# Patient Record
Sex: Female | Born: 1935 | Race: White | Hispanic: No | Smoking: Never smoker
Health system: Southern US, Community
[De-identification: ages and names within clinical notes are randomized; demographics above are authoritative.]

## PROBLEM LIST (undated history)

## (undated) DIAGNOSIS — J189 Pneumonia, unspecified organism: Secondary | ICD-10-CM

## (undated) DIAGNOSIS — M1990 Unspecified osteoarthritis, unspecified site: Secondary | ICD-10-CM

## (undated) DIAGNOSIS — C4491 Basal cell carcinoma of skin, unspecified: Secondary | ICD-10-CM

## (undated) HISTORY — PX: EYE SURGERY: SHX253

## (undated) HISTORY — PX: APPENDECTOMY: SHX54

## (undated) HISTORY — PX: TONSILLECTOMY: SUR1361

## (undated) HISTORY — PX: ABDOMINAL HYSTERECTOMY: SHX81

---
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
## 2016-11-22 ENCOUNTER — Ambulatory Visit: Payer: Self-pay | Admitting: Orthopedic Surgery

## 2016-12-03 NOTE — H&P (Signed)
TOTAL KNEE ADMISSION H&P  Patient is being admitted for right total knee arthroplasty.  Subjective:  Chief Complaint:right knee pain.  HPI: Holly Tucker, 83 y.o. female, has a history of pain and functional disability in the right knee due to arthritis and has failed non-surgical conservative treatments for greater than 12 weeks to includecorticosteriod injections, viscosupplementation injections, flexibility and strengthening excercises, use of assistive devices, weight reduction as appropriate and activity modification.  Onset of symptoms was gradual, starting 3 years ago with gradually worsening course since that time. The patient noted no past surgery on the right knee(s).  Patient currently rates pain in the bilaterally knee(s) at 8 out of 10 with activity. Patient has worsening of pain with activity and weight bearing, pain that interferes with activities of daily living, pain with passive range of motion and joint swelling.  Patient has evidence of subchondral sclerosis, periarticular osteophytes and joint space narrowing by imaging studies. There is no active infection.  There are no active problems to display for this patient.  No past medical history on file.  No past surgical history on file.  No prescriptions prior to admission.   Allergies not on file  Social History  Substance Use Topics  . Smoking status: Not on file  . Smokeless tobacco: Not on file  . Alcohol use Not on file    No family history on file.   Review of Systems  Constitutional: Negative.   HENT: Negative.   Eyes: Negative.   Respiratory: Negative.   Cardiovascular: Negative.   Gastrointestinal: Negative.   Genitourinary: Negative.   Musculoskeletal: Positive for joint pain.  Skin: Negative.   Endo/Heme/Allergies: Negative.   Psychiatric/Behavioral: Negative.     Objective:  Physical Exam  Constitutional: She is oriented to person, place, and time. She appears well-developed.  HENT:  Head:  Normocephalic.  Eyes: EOM are normal.  Neck: Normal range of motion.  Cardiovascular: Normal rate, normal heart sounds and intact distal pulses.   Respiratory: Effort normal.  Genitourinary:  Genitourinary Comments: Deferred  Musculoskeletal:  Right knee pain with ambulating. Fair ROM. Knee is stable at varus and valgus stress. BLE grossly n/v intact.  Neurological: She is alert and oriented to person, place, and time. She has normal reflexes.  Skin: Skin is warm and dry.  Psychiatric: Her behavior is normal.    Vital signs in last 24 hours: BP: ()/()  Arterial Line BP: ()/()   Labs:   There is no height or weight on file to calculate BMI.   Imaging Review Plain radiographs demonstrate moderate degenerative joint disease of the right knee(s). The overall alignment ismild varus. The bone quality appears to be good for age and reported activity level.  Assessment/Plan:  End stage arthritis, right knee   The patient history, physical examination, clinical judgment of the provider and imaging studies are consistent with end stage degenerative joint disease of the right knee(s) and total knee arthroplasty is deemed medically necessary. The treatment options including medical management, injection therapy arthroscopy and arthroplasty were discussed at length. The risks and benefits of total knee arthroplasty were presented and reviewed. The risks due to aseptic loosening, infection, stiffness, patella tracking problems, thromboembolic complications and other imponderables were discussed. The patient acknowledged the explanation, agreed to proceed with the plan and consent was signed. Patient is being admitted for inpatient treatment for surgery, pain control, PT, OT, prophylactic antibiotics, VTE prophylaxis, progressive ambulation and ADL's and discharge planning. The patient is planning to be discharged home with  home health services.  Will use IV tranexamic acid. Contraindications and  adverse affects of Tranexamic acid discussed in detail. Patient denies any of these at this time and understands the risks and benefits.

## 2016-12-17 ENCOUNTER — Encounter (HOSPITAL_COMMUNITY)
Admission: RE | Admit: 2016-12-17 | Discharge: 2016-12-17 | Disposition: A | Discharge status: Home / Self Care | Payer: Medicare Other | Source: Ambulatory Visit | Attending: Specialist | Admitting: Specialist

## 2016-12-17 ENCOUNTER — Encounter (HOSPITAL_COMMUNITY): Payer: Self-pay | Admitting: *Deleted

## 2016-12-17 DIAGNOSIS — M1711 Unilateral primary osteoarthritis, right knee: Secondary | ICD-10-CM | POA: Diagnosis not present

## 2016-12-17 DIAGNOSIS — Z01812 Encounter for preprocedural laboratory examination: Secondary | ICD-10-CM | POA: Diagnosis present

## 2016-12-17 HISTORY — DX: Unspecified osteoarthritis, unspecified site: M19.90

## 2016-12-17 HISTORY — DX: Pneumonia, unspecified organism: J18.9

## 2016-12-17 LAB — BASIC METABOLIC PANEL
ANION GAP: 10 (ref 5–15)
BUN: 24 mg/dL — ABNORMAL HIGH (ref 6–20)
CALCIUM: 10.6 mg/dL — AB (ref 8.9–10.3)
CHLORIDE: 101 mmol/L (ref 101–111)
CO2: 28 mmol/L (ref 22–32)
Creatinine, Ser: 0.85 mg/dL (ref 0.44–1.00)
GFR calc non Af Amer: >60 mL/min (ref >60)
Glucose, Bld: 104 mg/dL — ABNORMAL HIGH (ref 65–99)
Potassium: 4.7 mmol/L (ref 3.5–5.1)
Sodium: 139 mmol/L (ref 135–145)

## 2016-12-17 LAB — CBC
HCT: 43.8 % (ref 36.0–46.0)
HEMOGLOBIN: 14.7 g/dL (ref 12.0–15.0)
MCH: 30.3 pg (ref 26.0–34.0)
MCHC: 33.6 g/dL (ref 30.0–36.0)
MCV: 90.3 fL (ref 78.0–100.0)
Platelets: 218 10*3/uL (ref 150–400)
RBC: 4.85 MIL/uL (ref 3.87–5.11)
RDW: 12.8 % (ref 11.5–15.5)
WBC: 10 10*3/uL (ref 4.0–10.5)

## 2016-12-17 LAB — URINALYSIS, ROUTINE W REFLEX MICROSCOPIC
BILIRUBIN URINE: NEGATIVE
Glucose, UA: NEGATIVE mg/dL
Hgb urine dipstick: NEGATIVE
Ketones, ur: NEGATIVE mg/dL
Nitrite: POSITIVE — AB
PH: 5 (ref 5.0–8.0)
Protein, ur: NEGATIVE mg/dL
SPECIFIC GRAVITY, URINE: 1.015 (ref 1.005–1.030)

## 2016-12-17 LAB — PROTIME-INR
INR: 0.99
PROTHROMBIN TIME: 13 s (ref 11.4–15.2)

## 2016-12-17 LAB — SURGICAL PCR SCREEN
MRSA, PCR: NEGATIVE
STAPHYLOCOCCUS AUREUS: NEGATIVE

## 2016-12-17 LAB — ABO/RH: ABO/RH(D): O POS

## 2016-12-17 LAB — APTT: aPTT: 50 seconds — ABNORMAL HIGH (ref 24–36)

## 2016-12-17 NOTE — Patient Instructions (Signed)
Holly Tucker  12/17/2016   Your procedure is scheduled on: Friday 12/21/2016  Report to [facility] Main  Entrance take [place]  elevators to 3rd floor to  [place] at  [place] AM.  Call this number if you have problems the morning of surgery [phone]   Remember: ONLY 1 PERSON MAY GO WITH YOU TO SHORT STAY TO GET  READY MORNING OF [place].   Do not eat food or drink liquids :After Midnight.     Take these medicines the morning of surgery with A SIP OF WATER: Gabapentin                                 You may not have any metal on your body including hair pins and              piercings  Do not wear jewelry, make-up, lotions, powders or perfumes, deodorant             Do not wear nail polish.  Do not shave  48 hours prior to surgery.              Men may shave face and neck.   Do not bring valuables to the hospital. [place].  Contacts, dentures or bridgework may not be worn into surgery.  Leave suitcase in the car. After surgery it may be brought to your room.                  Please read over the following fact sheets you were given: _____________________________________________________________________             [facility] - Preparing for Surgery Before surgery, you can play an important role.  Because skin is not sterile, your skin needs to be as free of germs as possible.  You can reduce the number of germs on your skin by washing with CHG (chlorahexidine gluconate) soap before surgery.  CHG is an antiseptic cleaner which kills germs and bonds with the skin to continue killing germs even after washing. Please DO NOT use if you have an allergy to CHG or antibacterial soaps.  If your skin becomes reddened/irritated stop using the CHG and inform your nurse when you arrive at Short Stay. Do not shave (including legs and underarms) for at least 48 hours prior to the first CHG shower.  You may  shave your face/neck. Please follow these instructions carefully:  1.  Shower with CHG Soap the night before surgery and the  morning of Surgery.  2.  If you choose to wash your hair, wash your hair first as usual with your  normal  shampoo.  3.  After you shampoo, rinse your hair and body thoroughly to remove the  shampoo.                           4.  Use CHG as you would any other liquid soap.  You can apply chg directly  to the skin and wash                       Gently with a scrungie or clean washcloth.  5.  Apply  the CHG Soap to your body ONLY FROM THE NECK DOWN.   Do not use on face/ open                           Wound or open sores. Avoid contact with eyes, ears mouth and genitals (private parts).                       Wash face,  Genitals (private parts) with your normal soap.             6.  Wash thoroughly, paying special attention to the area where your surgery  will be performed.  7.  Thoroughly rinse your body with warm water from the neck down.  8.  DO NOT shower/wash with your normal soap after using and rinsing off  the CHG Soap.                9.  Pat yourself dry with a clean towel.            10.  Wear clean pajamas.            11.  Place clean sheets on your bed the night of your first shower and do not  sleep with pets. Day of Surgery : Do not apply any lotions/deodorants the morning of surgery.  Please wear clean clothes to the hospital/surgery center.  FAILURE TO FOLLOW THESE INSTRUCTIONS MAY RESULT IN THE CANCELLATION OF YOUR SURGERY PATIENT SIGNATURE_________________________________  NURSE SIGNATURE__________________________________  ________________________________________________________________________   Adam Phenix  An incentive spirometer is a tool that can help keep your lungs clear and active. This tool measures how well you are filling your lungs with each breath. Taking long deep breaths may help reverse or decrease the chance of developing  breathing (pulmonary) problems (especially infection) following:  A long period of time when you are unable to move or be active. BEFORE THE PROCEDURE   If the spirometer includes an indicator to show your best effort, your nurse or respiratory therapist will set it to a desired goal.  If possible, sit up straight or lean slightly forward. Try not to slouch.  Hold the incentive spirometer in an upright position. INSTRUCTIONS FOR USE  1. Sit on the edge of your bed if possible, or sit up as far as you can in bed or on a chair. 2. Hold the incentive spirometer in an upright position. 3. Breathe out normally. 4. Place the mouthpiece in your mouth and seal your lips tightly around it. 5. Breathe in slowly and as deeply as possible, raising the piston or the ball toward the top of the column. 6. Hold your breath for 3-5 seconds or for as long as possible. Allow the piston or ball to fall to the bottom of the column. 7. Remove the mouthpiece from your mouth and breathe out normally. 8. Rest for a few seconds and repeat Steps 1 through 7 at least 10 times every 1-2 hours when you are awake. Take your time and take a few normal breaths between deep breaths. 9. The spirometer may include an indicator to show your best effort. Use the indicator as a goal to work toward during each repetition. 10. After each set of 10 deep breaths, practice coughing to be sure your lungs are clear. If you have an incision (the cut made at the time of surgery), support your incision when coughing by placing a pillow or rolled  up towels firmly against it. Once you are able to get out of bed, walk around indoors and cough well. You may stop using the incentive spirometer when instructed by your caregiver.  RISKS AND COMPLICATIONS  Take your time so you do not get dizzy or light-headed.  If you are in pain, you may need to take or ask for pain medication before doing incentive spirometry. It is harder to take a deep  breath if you are having pain. AFTER USE  Rest and breathe slowly and easily.  It can be helpful to keep track of a log of your progress. Your caregiver can provide you with a simple table to help with this. If you are using the spirometer at home, follow these instructions: [place] IF:   You are having difficultly using the spirometer.  You have trouble using the spirometer as often as instructed.  Your pain medication is not giving enough relief while using the spirometer.  You develop fever of 100.5 F (38.1 C) or higher. SEEK IMMEDIATE MEDICAL CARE IF:   You cough up bloody sputum that had not been present before.  You develop fever of 102 F (38.9 C) or greater.  You develop worsening pain at or near the incision site. MAKE SURE YOU:   Understand these instructions.  Will watch your condition.  Will get help right away if you are not doing well or get worse. Document Released: 03/18/2007 Document Revised: 01/28/2012 Document Reviewed: 05/19/2007 ExitCare Patient Information 2014 ExitCare, [place].   ________________________________________________________________________  WHAT IS A BLOOD TRANSFUSION? Blood Transfusion Information  A transfusion is the replacement of blood or some of its parts. Blood is made up of multiple cells which provide different functions.  Red blood cells carry oxygen and are used for blood loss replacement.  White blood cells fight against infection.  Platelets control bleeding.  Plasma helps clot blood.  Other blood products are available for specialized needs, such as hemophilia or other clotting disorders. BEFORE THE TRANSFUSION  Who gives blood for transfusions?   Healthy volunteers who are fully evaluated to make sure their blood is safe. This is blood bank blood. Transfusion therapy is the safest it has ever been in the practice of medicine. Before blood is taken from a donor, a complete history is taken to make sure  that person has no history of diseases nor engages in risky social behavior (examples are intravenous drug use or sexual activity with multiple partners). The donor's travel history is screened to minimize risk of transmitting infections, such as malaria. The donated blood is tested for signs of infectious diseases, such as HIV and hepatitis. The blood is then tested to be sure it is compatible with you in order to minimize the chance of a transfusion reaction. If you or a relative donates blood, this is often done in anticipation of surgery and is not appropriate for emergency situations. It takes many days to process the donated blood. RISKS AND COMPLICATIONS Although transfusion therapy is very safe and saves many lives, the main dangers of transfusion include:   Getting an infectious disease.  Developing a transfusion reaction. This is an allergic reaction to something in the blood you were given. Every precaution is taken to prevent this. The decision to have a blood transfusion has been considered carefully by your caregiver before blood is given. Blood is not given unless the benefits outweigh the risks. AFTER THE TRANSFUSION  Right after receiving a blood transfusion, you will usually feel  much better and more energetic. This is especially true if your red blood cells have gotten low (anemic). The transfusion raises the level of the red blood cells which carry oxygen, and this usually causes an energy increase.  The nurse administering the transfusion will monitor you carefully for complications. HOME CARE INSTRUCTIONS  No special instructions are needed after a transfusion. You may find your energy is better. Speak with your caregiver about any limitations on activity for underlying diseases you may have. SEEK MEDICAL CARE IF:   Your condition is not improving after your transfusion.  You develop redness or irritation at the intravenous (IV) site. SEEK IMMEDIATE MEDICAL CARE IF:  Any of  the following symptoms occur over the next 12 hours:  Shaking chills.  You have a temperature by mouth above 102 F (38.9 C), not controlled by medicine.  Chest, back, or muscle pain.  People around you feel you are not acting correctly or are confused.  Shortness of breath or difficulty breathing.  Dizziness and fainting.  You get a rash or develop hives.  You have a decrease in urine output.  Your urine turns a dark color or changes to pink, red, or brown. Any of the following symptoms occur over the next 10 days:  You have a temperature by mouth above 102 F (38.9 C), not controlled by medicine.  Shortness of breath.  Weakness after normal activity.  The white part of the eye turns yellow (jaundice).  You have a decrease in the amount of urine or are urinating less often.  Your urine turns a dark color or changes to pink, red, or brown. Document Released: 11/02/2000 Document Revised: 01/28/2012 Document Reviewed: 06/21/2008 [facility] Patient Information 2014 [place], [place].  _______________________________________________________________________

## 2016-12-17 NOTE — Progress Notes (Signed)
04/24/2016- Pre-operative clearance from Dr. Matthias Hughs on chart. 09/18/2016- Office note from Dr. Matthias Hughs on chart.

## 2016-12-21 ENCOUNTER — Encounter (HOSPITAL_COMMUNITY)
Admission: RE | Disposition: A | Discharge status: Home Health | Payer: Self-pay | Source: Ambulatory Visit | Attending: Specialist

## 2016-12-21 ENCOUNTER — Inpatient Hospital Stay (HOSPITAL_COMMUNITY): Payer: Medicare Other | Admitting: Certified Registered Nurse Anesthetist

## 2016-12-21 ENCOUNTER — Encounter (HOSPITAL_COMMUNITY): Payer: Self-pay | Admitting: *Deleted

## 2016-12-21 ENCOUNTER — Inpatient Hospital Stay (HOSPITAL_COMMUNITY)
Admission: RE | Admit: 2016-12-21 | Discharge: 2016-12-24 | DRG: 470 | Disposition: A | Discharge status: Home Health | Payer: Medicare Other | Source: Ambulatory Visit | Attending: Specialist | Admitting: Specialist

## 2016-12-21 DIAGNOSIS — M1711 Unilateral primary osteoarthritis, right knee: Secondary | ICD-10-CM | POA: Diagnosis present

## 2016-12-21 DIAGNOSIS — Z96659 Presence of unspecified artificial knee joint: Secondary | ICD-10-CM

## 2016-12-21 DIAGNOSIS — M25561 Pain in right knee: Secondary | ICD-10-CM | POA: Diagnosis present

## 2016-12-21 DIAGNOSIS — K5900 Constipation, unspecified: Secondary | ICD-10-CM | POA: Diagnosis not present

## 2016-12-21 HISTORY — PX: TOTAL KNEE ARTHROPLASTY: SHX125

## 2016-12-21 LAB — TYPE AND SCREEN
ABO/RH(D): O POS
Antibody Screen: NEGATIVE

## 2016-12-21 SURGERY — ARTHROPLASTY, KNEE, TOTAL
Anesthesia: General | Site: Knee | Laterality: Right

## 2016-12-21 MED ORDER — DEXAMETHASONE SODIUM PHOSPHATE 10 MG/ML IJ SOLN
INTRAMUSCULAR | Status: AC
  Filled 2016-12-21: qty 1

## 2016-12-21 MED ORDER — MENTHOL 3 MG MT LOZG: 1.0000 | LOZENGE | OROMUCOSAL | Status: DC | PRN

## 2016-12-21 MED ORDER — MIDAZOLAM HCL 5 MG/5ML IJ SOLN
INTRAMUSCULAR | Status: DC | PRN
  Administered 2016-12-21 (×2): 1 mg via INTRAVENOUS

## 2016-12-21 MED ORDER — ONDANSETRON HCL 4 MG/2ML IJ SOLN: 4.0000 mg | Freq: Once | INTRAMUSCULAR | Status: DC | PRN

## 2016-12-21 MED ORDER — SODIUM CHLORIDE 0.9 % IV SOLN
1000.0000 mg | INTRAVENOUS | Status: AC
  Administered 2016-12-21: 1000 mg via INTRAVENOUS
  Filled 2016-12-21: qty 1100

## 2016-12-21 MED ORDER — FENTANYL CITRATE (PF) 100 MCG/2ML IJ SOLN
INTRAMUSCULAR | Status: AC
  Filled 2016-12-21: qty 2

## 2016-12-21 MED ORDER — KETOROLAC TROMETHAMINE 30 MG/ML IJ SOLN
INTRAMUSCULAR | Status: DC | PRN
  Administered 2016-12-21: 30 mg

## 2016-12-21 MED ORDER — AMITRIPTYLINE HCL 100 MG PO TABS
100.0000 mg | ORAL_TABLET | Freq: Every day | ORAL | Status: DC
  Administered 2016-12-21 – 2016-12-23 (×3): 100 mg via ORAL
  Filled 2016-12-21 (×3): qty 1

## 2016-12-21 MED ORDER — EPHEDRINE 5 MG/ML INJ
INTRAVENOUS | Status: AC
  Filled 2016-12-21: qty 10

## 2016-12-21 MED ORDER — OXYCODONE HCL 5 MG PO TABS: 5.0000 mg | ORAL_TABLET | Freq: Once | ORAL | Status: DC | PRN

## 2016-12-21 MED ORDER — ALUM & MAG HYDROXIDE-SIMETH 200-200-20 MG/5ML PO SUSP
30.0000 mL | ORAL | Status: DC | PRN
Start: 2016-12-21 — End: 2016-12-24

## 2016-12-21 MED ORDER — FERROUS SULFATE 325 (65 FE) MG PO TABS
325.0000 mg | ORAL_TABLET | Freq: Three times a day (TID) | ORAL | Status: DC
  Filled 2016-12-21 (×3): qty 1

## 2016-12-21 MED ORDER — ONDANSETRON HCL 4 MG/2ML IJ SOLN
INTRAMUSCULAR | Status: DC | PRN
  Administered 2016-12-21: 4 mg via INTRAVENOUS

## 2016-12-21 MED ORDER — OXYCODONE HCL 5 MG/5ML PO SOLN: 5.0000 mg | Freq: Once | ORAL | Status: DC | PRN

## 2016-12-21 MED ORDER — BUPIVACAINE HCL (PF) 0.25 % IJ SOLN
INTRAMUSCULAR | Status: DC | PRN
  Administered 2016-12-21: 30 mL

## 2016-12-21 MED ORDER — CEFAZOLIN SODIUM-DEXTROSE 2-4 GM/100ML-% IV SOLN
2.0000 g | INTRAVENOUS | Status: AC
  Administered 2016-12-21: 2 g via INTRAVENOUS
  Filled 2016-12-21: qty 100

## 2016-12-21 MED ORDER — LACTATED RINGERS IV SOLN: INTRAVENOUS | Status: DC

## 2016-12-21 MED ORDER — PROPOFOL 10 MG/ML IV BOLUS
INTRAVENOUS | Status: AC
  Filled 2016-12-21: qty 40

## 2016-12-21 MED ORDER — METOCLOPRAMIDE HCL 5 MG/ML IJ SOLN: 5.0000 mg | Freq: Three times a day (TID) | INTRAMUSCULAR | Status: DC | PRN

## 2016-12-21 MED ORDER — OXYCODONE HCL 5 MG PO TABS
5.0000 mg | ORAL_TABLET | ORAL | Status: DC | PRN
  Administered 2016-12-21 – 2016-12-24 (×12): 5 mg via ORAL
  Filled 2016-12-21 (×12): qty 1

## 2016-12-21 MED ORDER — SODIUM CHLORIDE 0.9 % IJ SOLN
INTRAMUSCULAR | Status: AC
  Filled 2016-12-21: qty 50

## 2016-12-21 MED ORDER — MIDAZOLAM HCL 2 MG/2ML IJ SOLN
INTRAMUSCULAR | Status: AC
  Filled 2016-12-21: qty 2

## 2016-12-21 MED ORDER — ASPIRIN EC 325 MG PO TBEC: 325.0000 mg | DELAYED_RELEASE_TABLET | Freq: Two times a day (BID) | ORAL | 0 refills | Status: DC

## 2016-12-21 MED ORDER — LACTATED RINGERS IV SOLN
INTRAVENOUS | Status: DC | PRN
  Administered 2016-12-21 (×2): via INTRAVENOUS

## 2016-12-21 MED ORDER — METOCLOPRAMIDE HCL 5 MG PO TABS: 5.0000 mg | ORAL_TABLET | Freq: Three times a day (TID) | ORAL | Status: DC | PRN

## 2016-12-21 MED ORDER — SODIUM CHLORIDE 0.9 % IJ SOLN
INTRAMUSCULAR | Status: DC | PRN
  Administered 2016-12-21: 30 mL

## 2016-12-21 MED ORDER — EPHEDRINE SULFATE 50 MG/ML IJ SOLN
INTRAMUSCULAR | Status: DC | PRN
  Administered 2016-12-21 (×2): 10 mg via INTRAVENOUS

## 2016-12-21 MED ORDER — SUCCINYLCHOLINE CHLORIDE 200 MG/10ML IV SOSY
PREFILLED_SYRINGE | INTRAVENOUS | Status: AC
  Filled 2016-12-21: qty 10

## 2016-12-21 MED ORDER — ONDANSETRON HCL 4 MG/2ML IJ SOLN: 4.0000 mg | Freq: Four times a day (QID) | INTRAMUSCULAR | Status: DC | PRN

## 2016-12-21 MED ORDER — BACLOFEN 10 MG PO TABS: 10.0000 mg | ORAL_TABLET | Freq: Three times a day (TID) | ORAL | 1 refills | Status: DC

## 2016-12-21 MED ORDER — MEPERIDINE HCL 50 MG/ML IJ SOLN: 6.2500 mg | INTRAMUSCULAR | Status: DC | PRN

## 2016-12-21 MED ORDER — DOCUSATE SODIUM 100 MG PO CAPS
100.0000 mg | ORAL_CAPSULE | Freq: Two times a day (BID) | ORAL | Status: DC
  Administered 2016-12-21 – 2016-12-24 (×6): 100 mg via ORAL
  Filled 2016-12-21 (×6): qty 1

## 2016-12-21 MED ORDER — METHOCARBAMOL 1000 MG/10ML IJ SOLN
500.0000 mg | Freq: Four times a day (QID) | INTRAVENOUS | Status: DC | PRN
  Filled 2016-12-21: qty 5

## 2016-12-21 MED ORDER — ACETAMINOPHEN 650 MG RE SUPP: 650.0000 mg | Freq: Four times a day (QID) | RECTAL | Status: DC | PRN

## 2016-12-21 MED ORDER — SODIUM CHLORIDE 0.9 % IR SOLN
Status: DC | PRN
  Administered 2016-12-21: 1000 mL

## 2016-12-21 MED ORDER — KETOROLAC TROMETHAMINE 30 MG/ML IJ SOLN
INTRAMUSCULAR | Status: AC
  Filled 2016-12-21: qty 1

## 2016-12-21 MED ORDER — DEXAMETHASONE SODIUM PHOSPHATE 10 MG/ML IJ SOLN
10.0000 mg | Freq: Once | INTRAMUSCULAR | Status: AC
  Administered 2016-12-22: 09:00:00 10 mg via INTRAVENOUS
  Filled 2016-12-21: qty 1

## 2016-12-21 MED ORDER — LIDOCAINE 2% (20 MG/ML) 5 ML SYRINGE
INTRAMUSCULAR | Status: AC
  Filled 2016-12-21: qty 5

## 2016-12-21 MED ORDER — SUGAMMADEX SODIUM 200 MG/2ML IV SOLN
INTRAVENOUS | Status: DC | PRN
  Administered 2016-12-21: 200 mg via INTRAVENOUS

## 2016-12-21 MED ORDER — GABAPENTIN 300 MG PO CAPS
300.0000 mg | ORAL_CAPSULE | Freq: Three times a day (TID) | ORAL | Status: DC
  Administered 2016-12-21 – 2016-12-24 (×10): 300 mg via ORAL
  Filled 2016-12-21 (×10): qty 1

## 2016-12-21 MED ORDER — CEFAZOLIN SODIUM-DEXTROSE 2-4 GM/100ML-% IV SOLN
INTRAVENOUS | Status: AC
  Filled 2016-12-21: qty 100

## 2016-12-21 MED ORDER — ENOXAPARIN SODIUM 30 MG/0.3ML ~~LOC~~ SOLN
30.0000 mg | Freq: Two times a day (BID) | SUBCUTANEOUS | Status: DC
  Administered 2016-12-22 – 2016-12-24 (×5): 30 mg via SUBCUTANEOUS
  Filled 2016-12-21 (×5): qty 0.3

## 2016-12-21 MED ORDER — FLEET ENEMA 7-19 GM/118ML RE ENEM: 1.0000 | ENEMA | Freq: Once | RECTAL | Status: DC | PRN

## 2016-12-21 MED ORDER — HYDROMORPHONE HCL 1 MG/ML IJ SOLN
0.2500 mg | INTRAMUSCULAR | Status: DC | PRN
Start: 2016-12-21 — End: 2016-12-21

## 2016-12-21 MED ORDER — SODIUM CHLORIDE 0.9 % IV SOLN
INTRAVENOUS | Status: DC
  Administered 2016-12-21 (×2): via INTRAVENOUS
  Filled 2016-12-21 (×5): qty 1000

## 2016-12-21 MED ORDER — COQ-10 200 MG PO CAPS: 1.0000 | ORAL_CAPSULE | Freq: Every day | ORAL | Status: DC

## 2016-12-21 MED ORDER — ROPIVACAINE HCL 7.5 MG/ML IJ SOLN
INTRAMUSCULAR | Status: DC | PRN
  Administered 2016-12-21: 20 mL via PERINEURAL

## 2016-12-21 MED ORDER — DIPHENHYDRAMINE HCL 12.5 MG/5ML PO ELIX: 12.5000 mg | ORAL_SOLUTION | ORAL | Status: DC | PRN

## 2016-12-21 MED ORDER — HYDROMORPHONE HCL 1 MG/ML IJ SOLN
1.0000 mg | INTRAMUSCULAR | Status: DC | PRN
  Administered 2016-12-23: 1 mg via INTRAVENOUS
  Filled 2016-12-21: qty 1

## 2016-12-21 MED ORDER — CEFAZOLIN SODIUM-DEXTROSE 2-4 GM/100ML-% IV SOLN
2.0000 g | Freq: Four times a day (QID) | INTRAVENOUS | Status: AC
  Administered 2016-12-21 (×2): 2 g via INTRAVENOUS
  Filled 2016-12-21 (×2): qty 100

## 2016-12-21 MED ORDER — ZOLPIDEM TARTRATE 5 MG PO TABS: 5.0000 mg | ORAL_TABLET | Freq: Every evening | ORAL | Status: DC | PRN

## 2016-12-21 MED ORDER — ONDANSETRON HCL 4 MG/2ML IJ SOLN
INTRAMUSCULAR | Status: AC
  Filled 2016-12-21: qty 2

## 2016-12-21 MED ORDER — PHENOL 1.4 % MT LIQD
1.0000 | OROMUCOSAL | Status: DC | PRN
  Filled 2016-12-21: qty 177

## 2016-12-21 MED ORDER — BISACODYL 5 MG PO TBEC: 5.0000 mg | DELAYED_RELEASE_TABLET | Freq: Every day | ORAL | Status: DC | PRN

## 2016-12-21 MED ORDER — ACETAMINOPHEN 325 MG PO TABS: 650.0000 mg | ORAL_TABLET | Freq: Four times a day (QID) | ORAL | Status: DC | PRN

## 2016-12-21 MED ORDER — PROPOFOL 10 MG/ML IV BOLUS
INTRAVENOUS | Status: DC | PRN
  Administered 2016-12-21: 150 mg via INTRAVENOUS

## 2016-12-21 MED ORDER — ONDANSETRON HCL 4 MG PO TABS: 4.0000 mg | ORAL_TABLET | Freq: Four times a day (QID) | ORAL | Status: DC | PRN

## 2016-12-21 MED ORDER — METHOCARBAMOL 500 MG PO TABS
500.0000 mg | ORAL_TABLET | Freq: Four times a day (QID) | ORAL | Status: DC | PRN
  Administered 2016-12-21 – 2016-12-22 (×3): 500 mg via ORAL
  Filled 2016-12-21 (×3): qty 1

## 2016-12-21 MED ORDER — FENTANYL CITRATE (PF) 100 MCG/2ML IJ SOLN: 25.0000 ug | INTRAMUSCULAR | Status: DC | PRN

## 2016-12-21 MED ORDER — ROPIVACAINE HCL 7.5 MG/ML IJ SOLN
INTRAMUSCULAR | Status: AC
  Filled 2016-12-21: qty 20

## 2016-12-21 MED ORDER — POTASSIUM CHLORIDE IN NACL 20-0.9 MEQ/L-% IV SOLN: INTRAVENOUS | Status: DC

## 2016-12-21 MED ORDER — LIDOCAINE 2% (20 MG/ML) 5 ML SYRINGE
INTRAMUSCULAR | Status: DC | PRN
  Administered 2016-12-21: 100 mg via INTRAVENOUS

## 2016-12-21 MED ORDER — DEXAMETHASONE SODIUM PHOSPHATE 10 MG/ML IJ SOLN
10.0000 mg | Freq: Once | INTRAMUSCULAR | Status: AC
  Administered 2016-12-21: 10 mg via INTRAVENOUS

## 2016-12-21 MED ORDER — ROCURONIUM BROMIDE 50 MG/5ML IV SOSY
PREFILLED_SYRINGE | INTRAVENOUS | Status: DC | PRN
  Administered 2016-12-21: 40 mg via INTRAVENOUS

## 2016-12-21 MED ORDER — OXYCODONE HCL 5 MG PO TABS: 5.0000 mg | ORAL_TABLET | ORAL | 0 refills | Status: DC | PRN

## 2016-12-21 MED ORDER — BUPIVACAINE HCL (PF) 0.25 % IJ SOLN
INTRAMUSCULAR | Status: AC
  Filled 2016-12-21: qty 30

## 2016-12-21 MED ORDER — FENTANYL CITRATE (PF) 100 MCG/2ML IJ SOLN
INTRAMUSCULAR | Status: DC | PRN
  Administered 2016-12-21 (×6): 50 ug via INTRAVENOUS

## 2016-12-21 MED ORDER — SUGAMMADEX SODIUM 200 MG/2ML IV SOLN
INTRAVENOUS | Status: AC
  Filled 2016-12-21: qty 2

## 2016-12-21 MED ORDER — POLYETHYLENE GLYCOL 3350 17 G PO PACK
17.0000 g | PACK | Freq: Every day | ORAL | Status: DC | PRN
Start: 2016-12-21 — End: 2016-12-24

## 2016-12-21 MED ORDER — ROCURONIUM BROMIDE 50 MG/5ML IV SOSY
PREFILLED_SYRINGE | INTRAVENOUS | Status: AC
  Filled 2016-12-21: qty 5

## 2016-12-21 SURGICAL SUPPLY — 57 items
BAG DECANTER FOR FLEXI CONT (MISCELLANEOUS) IMPLANT
BAG ZIPLOCK 12X15 (MISCELLANEOUS) ×6 IMPLANT
BANDAGE ACE 4X5 VEL STRL LF (GAUZE/BANDAGES/DRESSINGS) ×3 IMPLANT
BANDAGE ACE 6X5 VEL STRL LF (GAUZE/BANDAGES/DRESSINGS) ×3 IMPLANT
BLADE SAG 18X100X1.27 (BLADE) ×3 IMPLANT
BLADE SAW SGTL 13.0X1.19X90.0M (BLADE) ×3 IMPLANT
CAP KNEE TOTAL 3 SIGMA ×3 IMPLANT
CEMENT HV SMART SET (Cement) ×6 IMPLANT
CLOTH BEACON ORANGE TIMEOUT ST (SAFETY) ×3 IMPLANT
CUFF TOURN SGL QUICK 34 (TOURNIQUET CUFF) ×2
CUFF TRNQT CYL 34X4X40X1 (TOURNIQUET CUFF) ×1 IMPLANT
DECANTER SPIKE VIAL GLASS SM (MISCELLANEOUS) ×3 IMPLANT
DERMABOND ADVANCED (GAUZE/BANDAGES/DRESSINGS) ×2
DERMABOND ADVANCED .7 DNX12 (GAUZE/BANDAGES/DRESSINGS) ×1 IMPLANT
DRAPE U-SHAPE 47X51 STRL (DRAPES) ×3 IMPLANT
DRSG AQUACEL AG ADV 3.5X10 (GAUZE/BANDAGES/DRESSINGS) ×3 IMPLANT
DRSG TEGADERM 4X4.75 (GAUZE/BANDAGES/DRESSINGS) ×3 IMPLANT
DURAPREP 26ML APPLICATOR (WOUND CARE) ×6 IMPLANT
ELECT REM PT RETURN 9FT ADLT (ELECTROSURGICAL) ×3
ELECTRODE REM PT RTRN 9FT ADLT (ELECTROSURGICAL) ×1 IMPLANT
EVACUATOR 1/8 PVC DRAIN (DRAIN) ×3 IMPLANT
GAUZE SPONGE 2X2 8PLY STRL LF (GAUZE/BANDAGES/DRESSINGS) ×1 IMPLANT
GLOVE BIOGEL PI IND STRL 8 (GLOVE) ×2 IMPLANT
GLOVE BIOGEL PI INDICATOR 8 (GLOVE) ×4
GLOVE ECLIPSE 8.0 STRL XLNG CF (GLOVE) ×6 IMPLANT
GLOVE SURG ORTHO 9.0 STRL STRW (GLOVE) ×3 IMPLANT
GLOVE SURG SS PI 7.5 STRL IVOR (GLOVE) ×15 IMPLANT
GOWN STRL REUS W/TWL XL LVL3 (GOWN DISPOSABLE) ×12 IMPLANT
HANDPIECE INTERPULSE COAX TIP (DISPOSABLE) ×2
IMMOBILIZER KNEE 20 (SOFTGOODS) ×3
IMMOBILIZER KNEE 20 THIGH 36 (SOFTGOODS) ×1 IMPLANT
NS IRRIG 1000ML POUR BTL (IV SOLUTION) ×3 IMPLANT
PACK TOTAL KNEE CUSTOM (KITS) ×3 IMPLANT
POSITIONER SURGICAL ARM (MISCELLANEOUS) ×3 IMPLANT
SET HNDPC FAN SPRY TIP SCT (DISPOSABLE) ×1 IMPLANT
SET PAD KNEE POSITIONER (MISCELLANEOUS) ×3 IMPLANT
SPONGE GAUZE 2X2 STER 10/PKG (GAUZE/BANDAGES/DRESSINGS) ×2
SPONGE LAP 18X18 X RAY DECT (DISPOSABLE) IMPLANT
SPONGE SURGIFOAM ABS GEL 100 (HEMOSTASIS) IMPLANT
STOCKINETTE 6  STRL (DRAPES) ×2
STOCKINETTE 6 STRL (DRAPES) ×1 IMPLANT
SUCTION FRAZIER HANDLE 12FR (TUBING) ×2
SUCTION TUBE FRAZIER 12FR DISP (TUBING) ×1 IMPLANT
SUT BONE WAX W31G (SUTURE) IMPLANT
SUT MNCRL AB 3-0 PS2 18 (SUTURE) ×3 IMPLANT
SUT VIC AB 1 CT1 27 (SUTURE) ×8
SUT VIC AB 1 CT1 27XBRD ANTBC (SUTURE) ×4 IMPLANT
SUT VIC AB 2-0 CT1 27 (SUTURE) ×4
SUT VIC AB 2-0 CT1 TAPERPNT 27 (SUTURE) ×2 IMPLANT
SUT VLOC 180 0 24IN GS25 (SUTURE) ×3 IMPLANT
SYR 50ML LL SCALE MARK (SYRINGE) ×3 IMPLANT
TAPE STRIPS DRAPE STRL (GAUZE/BANDAGES/DRESSINGS) ×3 IMPLANT
TOWER CARTRIDGE SMART MIX (DISPOSABLE) ×3 IMPLANT
TRAY FOLEY CATH 14FR (SET/KITS/TRAYS/PACK) ×3 IMPLANT
WATER STERILE IRR 1500ML POUR (IV SOLUTION) ×6 IMPLANT
WRAP KNEE MAXI GEL POST OP (GAUZE/BANDAGES/DRESSINGS) ×3 IMPLANT
YANKAUER SUCT BULB TIP 10FT TU (MISCELLANEOUS) ×3 IMPLANT

## 2016-12-21 NOTE — Transfer of Care (Signed)
Immediate Anesthesia Transfer of Care Note  Patient: Holly Tucker  Procedure(s) Performed: Procedure(s): RIGHT TOTAL KNEE ARTHROPLASTY (Right)  Patient Location: PACU  Anesthesia Type:General  Level of Consciousness:  sedated, patient cooperative and responds to stimulation  Airway & Oxygen Therapy:Patient Spontanous Breathing and Patient connected to face mask oxgen  Post-op Assessment:  Report given to PACU RN and Post -op Vital signs reviewed and stable  Post vital signs:  Reviewed and stable  Last Vitals:  Vitals:   12/21/16 0607  BP: 139/67  Pulse: 69  Resp: 16  Temp: 123456 C    Complications: No apparent anesthesia complications

## 2016-12-21 NOTE — Interval H&P Note (Signed)
History and Physical Interval Note:  12/21/2016 7:42 AM  Holly Tucker  has presented today for surgery, with the diagnosis of RIGHT KNEE OSTEOARTHRITIS  The various methods of treatment have been discussed with the patient and family. After consideration of risks, benefits and other options for treatment, the patient has consented to  Procedure(s): RIGHT TOTAL KNEE ARTHROPLASTY (Right) as a surgical intervention .  The patient's history has been reviewed, patient examined, no change in status, stable for surgery.  I have reviewed the patient's chart and labs.  Questions were answered to the patient's satisfaction.     Antar Milks ANDREW

## 2016-12-21 NOTE — Progress Notes (Signed)
Orthopedic Tech Progress Note Patient Details:  Holly Tucker 1929/11/28 [id number]  Patient already has knee immobilizer and it was applied.     [name] 12/21/2016, 1:09 PM

## 2016-12-21 NOTE — Anesthesia Procedure Notes (Signed)
Procedure Name: Intubation Date/Time: 12/21/2016 7:50 AM Performed by: Maxwell Caul Pre-anesthesia Checklist: Patient identified, Emergency Drugs available, Suction available and Patient being monitored Patient Re-evaluated:Patient Re-evaluated prior to inductionOxygen Delivery Method: Circle system utilized Preoxygenation: Pre-oxygenation with 100% oxygen Intubation Type: IV induction Ventilation: Mask ventilation without difficulty Laryngoscope Size: Mac and 4 Grade View: Grade I Tube type: Oral Tube size: 7.5 mm Number of attempts: 1 Airway Equipment and Method: Stylet Placement Confirmation: ETT inserted through vocal cords under direct vision,  positive ETCO2 and breath sounds checked- equal and bilateral Secured at: 22 cm Tube secured with: Tape Dental Injury: Teeth and Oropharynx as per pre-operative assessment

## 2016-12-21 NOTE — Anesthesia Procedure Notes (Signed)
Performed by: Maxwell Caul

## 2016-12-21 NOTE — Anesthesia Procedure Notes (Signed)
Anesthesia Regional Block:  Adductor canal block  Pre-Anesthetic Checklist: ,, timeout performed, Correct Patient, Correct Site, Correct Laterality, Correct Procedure, Correct Position, site marked, Risks and benefits discussed,  Surgical consent,  Pre-op evaluation,  At surgeon's request and post-op pain management  Laterality: Right  Prep: chloraprep       Needles:  Injection technique: Single-shot  Needle Type: Echogenic Stimulator Needle     Needle Length: 9cm 9 cm Needle Gauge: 21 and 21 G    Additional Needles:  Procedures: ultrasound guided (picture in chart) Adductor canal block Narrative:  Start time: 12/21/2016 7:10 AM End time: 12/21/2016 7:20 AM Injection made incrementally with aspirations every 5 mL.  Events: injection painful  Performed by: Personally  Anesthesiologist: Lillia Abed  Additional Notes: Monitors applied. Patient sedated. Sterile prep and drape,hand hygiene and sterile gloves were used. Relevant anatomy identified.Needle position confirmed.Local anesthetic injected incrementally after negative aspiration. Local anesthetic spread visualized around nerve(s). Vascular puncture avoided. No complications. Image printed for medical record.The patient tolerated the procedure well.    Lillia Abed MD

## 2016-12-21 NOTE — Progress Notes (Signed)
PHARMACIST - PHYSICIAN ORDER COMMUNICATION  CONCERNING: P&T Medication Policy on Herbal Medications  DESCRIPTION:  This patient's order for:  CoQ-10 has been noted.  This product(s) is classified as an "herbal" or natural product. Due to a lack of definitive safety studies or FDA approval, nonstandard manufacturing practices, plus the potential risk of unknown drug-drug interactions while on inpatient medications, the Pharmacy and Therapeutics Committee does not permit the use of "herbal" or natural products of this type within [facility].   ACTION TAKEN: The pharmacy department is unable to verify this order at this time and your patient has been informed of this safety policy. Please reevaluate patient's clinical condition at discharge and address if the herbal or natural product(s) should be resumed at that time.   Dolly Rias RPh 12/21/2016, 11:16 AM Pager [phone]

## 2016-12-21 NOTE — Anesthesia Postprocedure Evaluation (Signed)
Anesthesia Post Note  Patient: Holly Tucker  Procedure(s) Performed: Procedure(s) (LRB): RIGHT TOTAL KNEE ARTHROPLASTY (Right)  Patient location during evaluation: PACU Anesthesia Type: General Level of consciousness: awake and alert Pain management: pain level controlled Vital Signs Assessment: post-procedure vital signs reviewed and stable Respiratory status: spontaneous breathing, nonlabored ventilation, respiratory function stable and patient connected to nasal cannula oxygen Cardiovascular status: blood pressure returned to baseline and stable Postop Assessment: no signs of nausea or vomiting Anesthetic complications: no       Last Vitals:  Vitals:   12/21/16 1415 12/21/16 1521  BP: (!) 114/49 (!) 116/49  Pulse: 74 72  Resp: 14 16  Temp: 36.5 C 36.4 C    Last Pain:  Vitals:   12/21/16 1521  TempSrc: Axillary  PainSc:                  [name]

## 2016-12-21 NOTE — Interval H&P Note (Signed)
History and Physical Interval Note:  12/21/2016 7:44 AM  Holly Tucker  has presented today for surgery, with the diagnosis of RIGHT KNEE OSTEOARTHRITIS  The various methods of treatment have been discussed with the patient and family. After consideration of risks, benefits and other options for treatment, the patient has consented to  Procedure(s): RIGHT TOTAL KNEE ARTHROPLASTY (Right) as a surgical intervention .  The patient's history has been reviewed, patient examined, no change in status, stable for surgery.  I have reviewed the patient's chart and labs.  Questions were answered to the patient's satisfaction.     Holly Tucker ANDREW

## 2016-12-21 NOTE — Care Management Note (Addendum)
Case Management Note  Patient Details  Name: Holly Tucker MRN: [id number] Date of Birth: 08-13-1930  Subjective/Objective:   83 y/o f admitted w/OA R knee. From home. Noted-[facility] CM providing all d/c needs.                 Action/Plan:d/c home w/[facility] @ home.per [facility] case per [facility] CM.   Expected Discharge Date:  12/23/16               Expected Discharge Plan:  [facility]  In-House Referral:     Discharge planning Services  CM Consult  Post Acute Care Choice:    Choice offered to:     DME Arranged:    DME Agency:     HH Arranged:    HH Agency:     Status of Service:  Completed, signed off  If discussed at [employer] of Stay Meetings, dates discussed:    Additional Comments:  Dessa Phi, RN 12/21/2016, 11:47 AM

## 2016-12-21 NOTE — Anesthesia Preprocedure Evaluation (Addendum)
Anesthesia Evaluation  Patient identified by MRN, date of birth, ID band Patient awake    Reviewed: Allergy & Precautions, NPO status , Patient's Chart, lab work & pertinent test results  Airway Mallampati: II  TM Distance: >3 FB Neck ROM: Full    Dental  (+) Teeth Intact, Dental Advisory Given   Pulmonary    breath sounds clear to auscultation       Cardiovascular  Rhythm:Regular     Neuro/Psych    GI/Hepatic   Endo/Other    Renal/GU      Musculoskeletal   Abdominal   Peds  Hematology   Anesthesia Other Findings   Reproductive/Obstetrics                             Anesthesia Physical Anesthesia Plan  ASA: II  Anesthesia Plan: General   Post-op Pain Management:  Regional for Post-op pain   Induction: Intravenous  Airway Management Planned: LMA  Additional Equipment:   Intra-op Plan:   Post-operative Plan: Extubation in OR  Informed Consent: I have reviewed the patients History and Physical, chart, labs and discussed the procedure including the risks, benefits and alternatives for the proposed anesthesia with the patient or authorized representative who has indicated his/her understanding and acceptance.   Dental advisory given  Plan Discussed with: CRNA and Surgeon  Anesthesia Plan Comments:        Anesthesia Quick Evaluation

## 2016-12-21 NOTE — Clinical Social Work Note (Signed)
Expected discharge plan is for pt to d/c home with home health.  CSW signing off.  Please enter a new consult should further needs arise.    [place], [place]

## 2016-12-21 NOTE — Evaluation (Signed)
Physical Therapy Evaluation Patient Details Name: Holly Tucker MRN: [id number] DOB: Aug 31, 1930 Today's Date: 12/21/2016   History of Present Illness  Pt s/p R TKR and with "bad L knee"  Clinical Impression  Pt s/p R TKR and presents with decreased R LE strength/ROM, poor condition of L knee, generalized weakness, post op pain and premorbid deconditioning.  Per pt report, plan by physician is home with assist of friends.    Follow Up Recommendations Home health PT    Equipment Recommendations  Rolling walker with 5" wheels    Recommendations for Other Services OT consult     Precautions / Restrictions Precautions Precautions: Fall Restrictions Weight Bearing Restrictions: No Other Position/Activity Restrictions: WBAT      Mobility  Bed Mobility Overal bed mobility: Needs Assistance Bed Mobility: Supine to Sit;Sit to Supine     Supine to sit: Min assist;Mod assist;+2 for physical assistance;+2 for safety/equipment Sit to supine: Min assist;Mod assist;+2 for physical assistance;+2 for safety/equipment   General bed mobility comments: Increased time and cues for sequence and use of L LE to self assist  Transfers Overall transfer level: Needs assistance Equipment used: Rolling walker (2 wheeled) Transfers: Sit to/from Stand Sit to Stand: Min assist;Mod assist;+2 physical assistance;+2 safety/equipment;From elevated surface         General transfer comment: cues for LE management and use of UEs to self assist.  Bed elevated to assist to standing  Ambulation/Gait Ambulation/Gait assistance: Mod assist;+2 physical assistance;+2 safety/equipment Ambulation Distance (Feet): 4 Feet Assistive device: Rolling walker (2 wheeled) Gait Pattern/deviations: Step-to pattern;Decreased step length - right;Decreased step length - left;Shuffle;Trunk flexed Gait velocity: decr Gait velocity interpretation: Below normal speed for age/gender General Gait Details: pt side stepped up side of  bed with RW, cues for sequence.  Physical assist for balance/support and RW management  Stairs            Wheelchair Mobility    Modified Rankin (Stroke Patients Only)       Balance Overall balance assessment: Needs assistance Sitting-balance support: Bilateral upper extremity supported;Feet supported Sitting balance-Leahy Scale: Fair     Standing balance support: Bilateral upper extremity supported Standing balance-Leahy Scale: Poor                               Pertinent Vitals/Pain Pain Assessment: 0-10 Pain Score: 4  Pain Location: R knee Pain Descriptors / Indicators: Aching;Sore Pain Intervention(s): Limited activity within patient's tolerance;Premedicated before session;Monitored during session;Ice applied    Home Living Family/patient expects to be discharged to:: Private residence Living Arrangements: Alone Available Help at Discharge: Friend(s) Type of Home: House Home Access: Stairs to enter Entrance Stairs-Rails: Psychiatric nurse of Steps: 5 Home Layout: Two level Home Equipment: Cane - single point Additional Comments: Pt has chair lift to access walk in shower in basement    Prior Function Level of Independence: Independent with assistive device(s)               Hand Dominance        Extremity/Trunk Assessment   Upper Extremity Assessment Upper Extremity Assessment: Overall WFL for tasks assessed    Lower Extremity Assessment Lower Extremity Assessment: RLE deficits/detail;LLE deficits/detail LLE Deficits / Details: 4/5 strength with knee flex to ~ 90    Cervical / Trunk Assessment Cervical / Trunk Assessment: Kyphotic  Communication   Communication: No difficulties  Cognition Arousal/Alertness: Awake/alert Behavior During Therapy: WFL for tasks assessed/performed Overall Cognitive  Status: Within Functional Limits for tasks assessed                      General Comments      Exercises      Assessment/Plan    PT Assessment Patient needs continued PT services  PT Problem List Decreased strength;Decreased range of motion;Decreased balance;Decreased activity tolerance;Decreased mobility;Decreased knowledge of use of DME;Pain          PT Treatment Interventions DME instruction;Gait training;Stair training;Functional mobility training;Therapeutic activities;Therapeutic exercise;Patient/family education    PT Goals (Current goals can be found in the Care Plan section)  Acute Rehab PT Goals Patient Stated Goal: Regain IND PT Goal Formulation: With patient Time For Goal Achievement: 12/28/16 Potential to Achieve Goals: Fair    Frequency 7X/week   Barriers to discharge        Co-evaluation               End of Session Equipment Utilized During Treatment: Gait belt Activity Tolerance: Patient limited by fatigue Patient left: in bed;with call bell/phone within reach;with bed alarm set;with family/visitor present Nurse Communication: Mobility status         Time: 1445-1520 PT Time Calculation (min) (ACUTE ONLY): 35 min   Charges:   PT Evaluation $PT Eval Low Complexity: 1 Procedure PT Treatments $Gait Training: 8-22 mins   PT G Codes:        Holly Tucker 2017/01/10, 5:10 PM

## 2016-12-21 NOTE — Progress Notes (Signed)
Chaplain stopped by while rounding to see how this patient was progressing.  After introducing myself, patient gladly shared that she had no children of her own but the three women that were in her room were women that she had adopted (figuratively speaking).   Chaplain and patient and other women talked about how well she was progressing and that she actually stood up today on her hip.  Patient said she was doing very well and really didn't need anything.  Chaplain made patient aware of [place] and asked her to let her nurse know if she needed anything even if it was to chat.  Berneice Heinrich. Chaplain Resident Pager:  [phone]   12/21/16 1607  Clinical Encounter Type  Visited With Patient  Visit Type Initial  Spiritual Encounters  Spiritual Needs Other (Comment)

## 2016-12-21 NOTE — Op Note (Signed)
DATE OF SURGERY:  12/21/2016  TIME: 9:40 AM  PATIENT NAME:  Holly Tucker    AGE: 83 y.o.   PRE-OPERATIVE DIAGNOSIS:  RIGHT KNEE OSTEOARTHRITIS  POST-OPERATIVE DIAGNOSIS:  RIGHT KNEE OSTEOARTHRITIS  PROCEDURE:  Procedure(s): RIGHT TOTAL KNEE ARTHROPLASTY  SURGEON:  Lindel Marcell ANDREW  ASSISTANT:  Bryson Stilwell, PA-C, present and scrubbed throughout the case, critical for assistance with exposure, retraction, instrumentation, and closure.  OPERATIVE IMPLANTS: Depuy PFC Sigma Rotating Platform.  Femur size 3, Tibia size 4, Patella size 38 3-peg oval button, with a 10 mm polyethylene insert.   PREOPERATIVE INDICATIONS:   Holly Tucker is a 83 y.o. year old female with end stage bone on bone arthritis of the knee who failed conservative treatment and elected for Total Knee Arthroplasty.   The risks, benefits, and alternatives were discussed at length including but not limited to the risks of infection, bleeding, nerve injury, stiffness, blood clots, the need for revision surgery, cardiopulmonary complications, among others, and they were willing to proceed.  OPERATIVE DESCRIPTION:  The patient was brought to the operative room and placed in a supine position.  Spinal anesthesia was administered.  IV antibiotics were given.  The lower extremity was prepped and draped in the usual sterile fashion.  Time out was performed.  The leg was elevated and exsanguinated and the tourniquet was inflated.  Anterior quadriceps tendon splitting approach was performed.  The patella was retracted and osteophytes were removed.  The anterior horn of the medial and lateral meniscus was removed and cruciate ligaments resected.   The distal femur was opened with the drill and the intramedullary distal femoral cutting jig was utilized, set at 5 degrees resecting 10 mm off the distal femur.  Care was taken to protect the collateral ligaments.  The distal femoral sizing jig was applied, taking care to avoid  notching.  Then the 4-in-1 cutting jig was applied and the anterior and posterior femur was cut, along with the chamfer cuts.    Then the extramedullary tibial cutting jig was utilized making the appropriate cut using the anterior tibial crest as a reference building in appropriate posterior slope.  Care was taken during the cut to protect the medial and collateral ligaments.  The proximal tibia was removed along with the posterior horns of the menisci.   The posterior medial femoral osteophytes and posterior lateral femoral osteophytes were removed.    The flexion gap was then measured and was symmetric with the extension gap, measured at 10.  I completed the distal femoral preparation using the appropriate jig to prepare the box.  The patella was then measured, and cut with the saw.    The proximal tibia sized and prepared accordingly with the reamer and the punch, and then all components were trialed with the trial insert.  The knee was found to have excellent balance and full motion.    The above named components were then cemented into place and all excess cement was removed.  The trial polyethylene component was in place during cementation, and then was exchanged for the real polyethylene component.    The knee was easily taken through a range of motion and the patella tracked well and the knee irrigated copiously and the parapatellar and subcutaneous tissue closed with vicryl, and monocryl with steri strips for the skin.  The arthrotomy was closed at 90 of flexion. The wounds were dressed with sterile gauze and the tourniquet released and the patient was awakened and returned to the PACU in stable  and satisfactory condition.  There were no complications.  Total tourniquet time was 90 minutes.

## 2016-12-22 LAB — CBC
HEMATOCRIT: 32.4 % — AB (ref 36.0–46.0)
Hemoglobin: 10.7 g/dL — ABNORMAL LOW (ref 12.0–15.0)
MCH: 30.7 pg (ref 26.0–34.0)
MCHC: 33 g/dL (ref 30.0–36.0)
MCV: 92.8 fL (ref 78.0–100.0)
PLATELETS: 177 10*3/uL (ref 150–400)
RBC: 3.49 MIL/uL — ABNORMAL LOW (ref 3.87–5.11)
RDW: 13.1 % (ref 11.5–15.5)
WBC: 11.8 10*3/uL — AB (ref 4.0–10.5)

## 2016-12-22 LAB — BASIC METABOLIC PANEL
Anion gap: 5 (ref 5–15)
BUN: 17 mg/dL (ref 6–20)
CALCIUM: 8.4 mg/dL — AB (ref 8.9–10.3)
CO2: 26 mmol/L (ref 22–32)
CREATININE: 0.77 mg/dL (ref 0.44–1.00)
Chloride: 107 mmol/L (ref 101–111)
GLUCOSE: 127 mg/dL — AB (ref 65–99)
Potassium: 5.1 mmol/L (ref 3.5–5.1)
Sodium: 138 mmol/L (ref 135–145)

## 2016-12-22 NOTE — Progress Notes (Signed)
Physical Therapy Treatment Patient Details Name: Holly Tucker MRN: [id number] DOB: [birth year] Today's Date: 12/22/2016    History of Present Illness Pt s/p R TKR and with "bad L knee"    PT Comments    Pt continues motivated but progressing slowly with mobility and requiring significant assist for all mobility tasks.  Follow Up Recommendations  Home health PT     Equipment Recommendations  Rolling walker with 5" wheels    Recommendations for Other Services OT consult     Precautions / Restrictions Precautions Precautions: Fall Restrictions Weight Bearing Restrictions: No Other Position/Activity Restrictions: WBAT    Mobility  Bed Mobility Overal bed mobility: Needs Assistance Bed Mobility: Sit to Supine       Sit to supine: Min assist;Mod assist   General bed mobility comments: increased time with cues for sequence and use of L LE to self assist  Transfers Overall transfer level: Needs assistance Equipment used: Rolling walker (2 wheeled) Transfers: Sit to/from Stand Sit to Stand: Mod assist         General transfer comment: assist to rise and stabilize.  Cues for UE/LE placement  Ambulation/Gait Ambulation/Gait assistance: Mod assist;+2 safety/equipment Ambulation Distance (Feet): 38 Feet Assistive device: Rolling walker (2 wheeled) Gait Pattern/deviations: Step-to pattern;Decreased step length - right;Decreased step length - left;Shuffle;Trunk flexed Gait velocity: decr Gait velocity interpretation: Below normal speed for age/gender General Gait Details: cues for sequence, posture, position from RW and increased UE WB.  Physical assist for support, balance and RW management   Stairs            Wheelchair Mobility    Modified Rankin (Stroke Patients Only)       Balance Overall balance assessment: Needs assistance Sitting-balance support: Bilateral upper extremity supported;Feet supported Sitting balance-Leahy Scale: Fair     Standing  balance support: Bilateral upper extremity supported Standing balance-Leahy Scale: Poor                      Cognition Arousal/Alertness: Awake/alert Behavior During Therapy: WFL for tasks assessed/performed Overall Cognitive Status: Within Functional Limits for tasks assessed                      Exercises      General Comments        Pertinent Vitals/Pain Pain Assessment: 0-10 Pain Score: 6  Pain Location: R knee Pain Descriptors / Indicators: Aching;Sore Pain Intervention(s): Limited activity within patient's tolerance;Monitored during session;Premedicated before session;Ice applied    Home Living Family/patient expects to be discharged to:: Private residence Living Arrangements: Alone Available Help at Discharge: Friend(s);Available PRN/intermittently         Home Equipment: Shower seat Additional Comments: she will have friend stay for 3 days, day and night. Niece will stay nights and friends will provide intermittent assistance    Prior Function Level of Independence: Independent with assistive device(s)          PT Goals (current goals can now be found in the care plan section) Acute Rehab PT Goals Patient Stated Goal: Regain IND PT Goal Formulation: With patient Time For Goal Achievement: 12/28/16 Potential to Achieve Goals: Fair Progress towards PT goals: Progressing toward goals    Frequency    7X/week      PT Plan Current plan remains appropriate    Co-evaluation             End of Session Equipment Utilized During Treatment: Gait belt;Right knee immobilizer Activity Tolerance: Patient  limited by fatigue;Patient limited by pain Patient left: in bed;with call bell/phone within reach;with family/visitor present     Time: 1453-1516 PT Time Calculation (min) (ACUTE ONLY): 23 min  Charges:  $Gait Training: 23-37 mins                    G Codes:      Gorman Safi 01-15-17, 4:36 PM

## 2016-12-22 NOTE — Progress Notes (Signed)
Physical Therapy Treatment Patient Details Name: Holly Tucker MRN: [id number] DOB: [birth year] Today's Date: 12/22/2016    History of Present Illness Pt s/p R TKR and with "bad L knee"    PT Comments    Pt progressing slowly with mobility and continues to require +2 assist for OOB activity.  Follow Up Recommendations  Home health PT     Equipment Recommendations  Rolling walker with 5" wheels    Recommendations for Other Services OT consult     Precautions / Restrictions Precautions Precautions: Fall Restrictions Weight Bearing Restrictions: No Other Position/Activity Restrictions: WBAT    Mobility  Bed Mobility Overal bed mobility: Needs Assistance Bed Mobility: Supine to Sit     Supine to sit: Min assist;Mod assist;+2 for physical assistance;+2 for safety/equipment     General bed mobility comments: Increased time and cues for sequence and use of L LE to self assist  Transfers Overall transfer level: Needs assistance Equipment used: Rolling walker (2 wheeled) Transfers: Sit to/from Stand Sit to Stand: Min assist;Mod assist;+2 physical assistance;+2 safety/equipment;From elevated surface         General transfer comment: cues for LE management and use of UEs to self assist.  Bed elevated to assist to standing  Ambulation/Gait Ambulation/Gait assistance: Mod assist;+2 physical assistance;+2 safety/equipment Ambulation Distance (Feet): 20 Feet Assistive device: Rolling walker (2 wheeled) Gait Pattern/deviations: Step-to pattern;Decreased step length - right;Decreased step length - left;Shuffle;Trunk flexed Gait velocity: decr Gait velocity interpretation: Below normal speed for age/gender General Gait Details: cues for sequence, posture, position from RW and increased UE WB.  Physical assist for support, balance and RW management   Stairs            Wheelchair Mobility    Modified Rankin (Stroke Patients Only)       Balance Overall balance  assessment: Needs assistance Sitting-balance support: Bilateral upper extremity supported;Feet supported Sitting balance-Leahy Scale: Fair     Standing balance support: Bilateral upper extremity supported Standing balance-Leahy Scale: Poor                      Cognition Arousal/Alertness: Awake/alert Behavior During Therapy: WFL for tasks assessed/performed Overall Cognitive Status: Within Functional Limits for tasks assessed                      Exercises Total Joint Exercises Ankle Circles/Pumps: AROM;Both;20 reps;Supine Quad Sets: AROM;Both;10 reps;Supine Heel Slides: AAROM;Right;10 reps;Supine Straight Leg Raises: AAROM;Right;10 reps;Supine Goniometric ROM: AAROM R knee -10 - 40    General Comments        Pertinent Vitals/Pain Pain Assessment: 0-10 Pain Score: 7  Pain Location: R knee Pain Descriptors / Indicators: Aching;Sore Pain Intervention(s): Limited activity within patient's tolerance;Monitored during session;Premedicated before session;Ice applied    Home Living                      Prior Function            PT Goals (current goals can now be found in the care plan section) Acute Rehab PT Goals Patient Stated Goal: Regain IND PT Goal Formulation: With patient Time For Goal Achievement: 12/28/16 Potential to Achieve Goals: Fair Progress towards PT goals: Progressing toward goals    Frequency    7X/week      PT Plan Current plan remains appropriate    Co-evaluation             End of Session Equipment Utilized During Treatment: Gait  belt;Right knee immobilizer Activity Tolerance: Patient limited by fatigue;Patient limited by pain Patient left: in chair;with call bell/phone within reach     Time: 0920-0952 PT Time Calculation (min) (ACUTE ONLY): 32 min  Charges:  $Gait Training: 8-22 mins $Therapeutic Exercise: 8-22 mins                    G Codes:      Holly Tucker 12/23/2016, 12:16 PM

## 2016-12-22 NOTE — Evaluation (Signed)
Occupational Therapy Evaluation Patient Details Name: Holly Tucker MRN: [id number] DOB: 08-10-1930 Today's Date: 12/22/2016    History of Present Illness Pt s/p R TKR and with "bad L knee"   Clinical Impression   This 83 year old female was admitted for the above sx. She will benefit from continued OT to increase safety and independence with adls.  Pt is arranging assistance at home.  Focus with OT will be on toilet transfers and standing for someone to assist with adls.  Goals are for min A level    Follow Up Recommendations  Supervision/Assistance - 24 hour    Equipment Recommendations  3 in 1 bedside commode (if she doesn't have this)    Recommendations for Other Services       Precautions / Restrictions Precautions Precautions: Fall Restrictions Weight Bearing Restrictions: No Other Position/Activity Restrictions: WBAT      Mobility Bed Mobility Overal bed mobility: Needs Assistance Bed Mobility: Supine to Sit     Supine to sit: Min assist;Mod assist;+2 for physical assistance;+2 for safety/equipment     General bed mobility comments: oob with PT  Transfers Overall transfer level: Needs assistance Equipment used: Rolling walker (2 wheeled) Transfers: Sit to/from Stand Sit to Stand: Mod assist         General transfer comment: assist to rise and stabilize.  Cues for UE/LE placement    Balance Overall balance assessment: Needs assistance Sitting-balance support: Bilateral upper extremity supported;Feet supported Sitting balance-Leahy Scale: Fair     Standing balance support: Bilateral upper extremity supported Standing balance-Leahy Scale: Poor                              ADL Overall ADL's : Needs assistance/impaired     Grooming: Set up;Sitting   Upper Body Bathing: Set up;Sitting   Lower Body Bathing: Moderate assistance;Sit to/from stand   Upper Body Dressing : Set up;Sitting   Lower Body Dressing: Maximal assistance;Sit  to/from stand                 General ADL Comments: performed only sit to stand this session. Pt has catheter in place. Will perform BSC transfer on next visit.  Educated on AE, but pt cannot lift RLE by herself.       Vision     Perception     Praxis      Pertinent Vitals/Pain Pain Assessment: 0-10 Pain Score: 6  Pain Location: R knee Pain Descriptors / Indicators: Aching;Sore Pain Intervention(s): Limited activity within patient's tolerance;Monitored during session;Premedicated before session;Repositioned (removed ice)     Hand Dominance     Extremity/Trunk Assessment Upper Extremity Assessment Upper Extremity Assessment: Overall WFL for tasks assessed           Communication Communication Communication: No difficulties   Cognition Arousal/Alertness: Awake/alert Behavior During Therapy: WFL for tasks assessed/performed Overall Cognitive Status: Within Functional Limits for tasks assessed                     General Comments       Exercises      Shoulder Instructions      Home Living Family/patient expects to be discharged to:: Private residence Living Arrangements: Alone Available Help at Discharge: Friend(s);Available PRN/intermittently               Bathroom Shower/Tub: Walk-in shower (downstairs:  has chair lift)   Bathroom Toilet: Handicapped height  Home Equipment: Shower seat   Additional Comments: she will have friend stay for 3 days, day and night. Niece will stay nights and friends will provide intermittent assistance      Prior Functioning/Environment Level of Independence: Independent with assistive device(s)                 OT Problem List: Decreased strength;Decreased activity tolerance;Pain;Decreased knowledge of use of DME or AE   OT Treatment/Interventions: Self-care/ADL training;DME and/or AE instruction;Patient/family education;Therapeutic activities    OT Goals(Current goals can be found in the  care plan section) Acute Rehab OT Goals Patient Stated Goal: Regain IND OT Goal Formulation: With patient Time For Goal Achievement: 12/29/16 Potential to Achieve Goals: Good ADL Goals Pt Will Transfer to Toilet: with min assist;stand pivot transfer;bedside commode Additional ADL Goal #1: pt will go from sit to stand with min A and maintain for 2 minutes with min guard during adls  OT Frequency: Min 2X/week   Barriers to D/C:            Co-evaluation              End of Session    Activity Tolerance: Patient tolerated treatment well Patient left: in chair;with call bell/phone within reach   Time: [id number] OT Time Calculation (min): 15 min Charges:  OT General Charges $OT Visit: 1 Procedure OT Evaluation $OT Eval Low Complexity: 1 Procedure G-Codes:    Tomekia Helton January 21, 2017, 3:33 PM Lesle Chris, OTR/L [phone] January 21, 2017

## 2016-12-22 NOTE — Care Management Note (Signed)
Case Management Note  Patient Details  Name: Nakeita Ulman MRN: [id number] Date of Birth: [birth year]  Subjective/Objective:    Right TKA                Action/Plan::Discharge Planning: AVS reviewed NCM spoke to pt and offered choice for [facility]. Preoperatively arranged with [facility]. Pt agreeable to [facility] for [facility]. Pt requesting RW for home. Contacted [facility] DME rep for RW for home.    PCP TAPPER, DAVID B.  Expected Discharge Date:  12/23/16               Expected Discharge Plan:  Home/Self Care  In-House Referral:  NA  Discharge planning Services  CM Consult  Post Acute Care Choice:  Home Health Choice offered to:  Patient  DME Arranged:  Walker rolling DME Agency:  [place]:  PT [place] Agency:  [facility] ([facility])  Status of Service:  Completed, signed off  If discussed at [employer], dates discussed:    Additional Comments:  Erenest Rasher, RN 12/22/2016, 11:10 AM

## 2016-12-22 NOTE — Progress Notes (Signed)
     Subjective: 1 Day Post-Op Procedure(s) (LRB): RIGHT TOTAL KNEE ARTHROPLASTY (Right)   Patient reports pain as mild, pain controlled. No events throughout the night.  Worked with PT, but didn't get very far with walking.    Objective:   VITALS:   Vitals:   12/22/16 1042 12/22/16 1417  BP: (!) 125/45 (!) 136/49  Pulse: 61 66  Resp: 16 16  Temp: 97.5 F (36.4 C) 97.8 F (36.6 C)   HV drain pulled  Dorsiflexion/Plantar flexion intact Incision: dressing C/D/I No cellulitis present Compartment soft  LABS  Recent Labs  12/22/16 0537  HGB 10.7*  HCT 32.4*  WBC 11.8*  PLT 177     Recent Labs  12/22/16 0537  NA 138  K 5.1  BUN 17  CREATININE 0.77  GLUCOSE 127*     Assessment/Plan: 1 Day Post-Op Procedure(s) (LRB): RIGHT TOTAL KNEE ARTHROPLASTY (Right) Advance diet Up with therapy D/C IV fluids Discharge home eventually, when ready    [place]. Elwanda Moger   PAC  12/22/2016, 4:27 PM

## 2016-12-23 LAB — CBC
HCT: 36.4 % (ref 36.0–46.0)
Hemoglobin: 12.5 g/dL (ref 12.0–15.0)
MCH: 31.3 pg (ref 26.0–34.0)
MCHC: 34.3 g/dL (ref 30.0–36.0)
MCV: 91 fL (ref 78.0–100.0)
Platelets: 177 10*3/uL (ref 150–400)
RBC: 4 MIL/uL (ref 3.87–5.11)
RDW: 13 % (ref 11.5–15.5)
WBC: 11.9 10*3/uL — ABNORMAL HIGH (ref 4.0–10.5)

## 2016-12-23 MED ORDER — SODIUM CHLORIDE 0.9 % IV SOLN
INTRAVENOUS | Status: DC
  Administered 2016-12-23: 10:00:00 via INTRAVENOUS

## 2016-12-23 NOTE — Progress Notes (Signed)
Physical Therapy Treatment Patient Details Name: Holly Tucker MRN: [id number] DOB: [birth year] Today's Date: 12/23/2016    History of Present Illness Pt s/p R TKR and with "bad L knee"    PT Comments    POD # 2 am session Pt unable to take her pain meds due to earlier incident of choking on some breakfast sausage and now having throat discomfort/guarding when she swallows.  Session limited due to pain.     Follow Up Recommendations  Home health PT     Equipment Recommendations  Rolling walker with 5" wheels    Recommendations for Other Services       Precautions / Restrictions Precautions Precautions: Fall Precaution Comments: instructed on use of KI Required Braces or Orthoses: Knee Immobilizer - Right Knee Immobilizer - Right: Discontinue once straight leg raise with < 10 degree lag Restrictions Weight Bearing Restrictions: No Other Position/Activity Restrictions: WBAT    Mobility  Bed Mobility Overal bed mobility: Needs Assistance Bed Mobility: Supine to Sit     Supine to sit: Min assist     General bed mobility comments: 50% VC's on proper tech and increased time  Transfers Overall transfer level: Needs assistance Equipment used: Rolling walker (2 wheeled) Transfers: Sit to/from Stand Sit to Stand: Min assist         General transfer comment: 25% VC's on proper hand placement and R LE advancement plus increased time  Ambulation/Gait Ambulation/Gait assistance: Min assist;Mod assist Ambulation Distance (Feet): 22 Feet Assistive device: Rolling walker (2 wheeled) Gait Pattern/deviations: Step-to pattern;Decreased stance time - right Gait velocity: decr   General Gait Details: limited distance due to increased pain.  Unsteady/choppy gait.  50% VC's on proper walker to self distance.     Stairs            Wheelchair Mobility    Modified Rankin (Stroke Patients Only)       Balance                                     Cognition Arousal/Alertness: Awake/alert Behavior During Therapy: WFL for tasks assessed/performed Overall Cognitive Status: Within Functional Limits for tasks assessed                      Exercises      General Comments        Pertinent Vitals/Pain Pain Assessment: 0-10 Pain Score: 7  Pain Location: R knee Pain Descriptors / Indicators: Aching;Sore Pain Intervention(s): Monitored during session;Repositioned;Ice applied    Home Living Family/patient expects to be discharged to:: Private residence Living Arrangements: Alone Available Help at Discharge: Friend(s);Available PRN/intermittently                Prior Function            PT Goals (current goals can now be found in the care plan section) Progress towards PT goals: Progressing toward goals    Frequency    7X/week      PT Plan Current plan remains appropriate    Co-evaluation             End of Session Equipment Utilized During Treatment: Gait belt;Right knee immobilizer Activity Tolerance: Patient limited by fatigue;Patient limited by pain Patient left: in chair;with chair alarm set;with call bell/phone within reach     Time: [id number] PT Time Calculation (min) (ACUTE ONLY): 15 min  Charges:  $Gait Training: 8-22 mins  G Codes:      Rica Koyanagi  PTA [facility]  Acute  Rehab Pager      [phone]

## 2016-12-23 NOTE — Progress Notes (Signed)
Physical Therapy Treatment Patient Details Name: Holly Tucker MRN: [id number] DOB: March 17, 1930 Today's Date: 12/23/2016    History of Present Illness Pt s/p R TKR and with "bad L knee"    PT Comments    POD # 2 pm session Applied KI and instructed on use.  Assisted with amb a greater distance in hallway.  Assisted to bathroom to void.  Assisted back to bed then performed all supine TKR TE's followed by ICE.  Unable to attempt stairs due to gait instability and pain level.  Pt plans to D/C to home tomorrow.   Follow Up Recommendations  Home health PT     Equipment Recommendations  Rolling walker with 5" wheels    Recommendations for Other Services       Precautions / Restrictions Precautions Precautions: Fall Precaution Comments: instructed on use of KI Required Braces or Orthoses: Knee Immobilizer - Right Knee Immobilizer - Right: Discontinue once straight leg raise with < 10 degree lag Restrictions Weight Bearing Restrictions: No Other Position/Activity Restrictions: WBAT    Mobility  Bed Mobility Overal bed mobility: Needs Assistance Bed Mobility: Sit to Supine     Supine to sit: Min assist Sit to supine: Min assist   General bed mobility comments: min assist back to bed  Transfers Overall transfer level: Needs assistance Equipment used: Rolling walker (2 wheeled) Transfers: Sit to/from Stand Sit to Stand: Min assist         General transfer comment: 25% VC's on proper hand placement and R LE advancement plus increased time  Ambulation/Gait Ambulation/Gait assistance: Min assist;Mod assist Ambulation Distance (Feet): 35 Feet Assistive device: Rolling walker (2 wheeled) Gait Pattern/deviations: Step-to pattern;Decreased stance time - right Gait velocity: decr   General Gait Details: tolerated an increased distance but still unsteady/choppy.  HIGH FALL RISK.     Stairs            Wheelchair Mobility    Modified Rankin (Stroke Patients Only)        Balance                                    Cognition Arousal/Alertness: Awake/alert Behavior During Therapy: WFL for tasks assessed/performed Overall Cognitive Status: Within Functional Limits for tasks assessed                      Exercises      General Comments        Pertinent Vitals/Pain Pain Assessment: 0-10 Pain Score: 7  Pain Location: R knee Pain Descriptors / Indicators: Aching;Sore Pain Intervention(s): Monitored during session;Repositioned;Ice applied    Home Living                      Prior Function            PT Goals (current goals can now be found in the care plan section) Progress towards PT goals: Progressing toward goals    Frequency    7X/week      PT Plan Current plan remains appropriate    Co-evaluation             End of Session Equipment Utilized During Treatment: Gait belt;Right knee immobilizer Activity Tolerance: Patient limited by fatigue;Patient limited by pain Patient left: in chair;with chair alarm set;with call bell/phone within reach     Time: [id number] PT Time Calculation (min) (ACUTE ONLY): 39 min  Charges:  $  Gait Training: 8-22 mins $Therapeutic Exercise: 8-22 mins $Therapeutic Activity: 8-22 mins                    G Codes:      Rica Koyanagi  PTA [facility]  Acute  Rehab Pager      [phone]

## 2016-12-23 NOTE — Progress Notes (Signed)
PA informed of patient not meeting PT goals, and will not be discharging today. Discharge order cancelled.

## 2016-12-23 NOTE — Progress Notes (Signed)
Occupational Therapy Treatment Patient Details Name: Holly Tucker MRN: [id number] DOB: Jun 07, 1930 Today's Date: 12/23/2016    History of present illness Pt s/p R TKR and with "bad L knee"   OT comments  Pt making progress; limited by pain this session.  Pt remains very motivated  Follow Up Recommendations  Supervision/Assistance - 24 hour    Equipment Recommendations  None recommended by OT (pt has borrowed a 3:1)    Recommendations for Other Services      Precautions / Restrictions Precautions Precautions: Fall Restrictions Weight Bearing Restrictions: No Other Position/Activity Restrictions: WBAT       Mobility Bed Mobility               General bed mobility comments: OOB prior to OT  Transfers   Equipment used: Rolling walker (2 wheeled)   Sit to Stand: Min assist         General transfer comment: assist to rise and stabilize.  Cues for UE/LE placement    Balance                                   ADL                           Toilet Transfer: Minimal assistance;Stand-pivot;BSC;RW   Toileting- Clothing Manipulation and Hygiene: Minimal assistance;Sit to/from stand         General ADL Comments: spt due to pain.  Pt states she had a choking episode earlier and got behind on pain meds      Vision                     Perception     Praxis      Cognition   Behavior During Therapy: WFL for tasks assessed/performed Overall Cognitive Status: Within Functional Limits for tasks assessed                       Extremity/Trunk Assessment               Exercises     Shoulder Instructions       General Comments      Pertinent Vitals/ Pain       Pain Score: 8  Pain Location: R knee Pain Descriptors / Indicators: Aching;Sore Pain Intervention(s): Limited activity within patient's tolerance;Monitored during session;Premedicated before session;Repositioned;Ice applied  Home Living Family/patient  expects to be discharged to:: Private residence Living Arrangements: Alone Available Help at Discharge: Friend(s);Available PRN/intermittently                                    Prior Functioning/Environment              Frequency           Progress Toward Goals  OT Goals(current goals can now be found in the care plan section)  Progress towards OT goals: Goals met and updated - see care plan  ADL Goals Pt Will Transfer to Toilet: with min assist;ambulating;bedside commode Additional ADL Goal #1: pt will perform bed mobility with min guard in preparation for adls  Plan      Co-evaluation                 End of Session     Activity Tolerance Patient  tolerated treatment well   Patient Left in chair;with call bell/phone within reach   Nurse Communication          Time: [id number] OT Time Calculation (min): 23 min  Charges: OT General Charges $OT Visit: 1 Procedure OT Treatments $Self Care/Home Management : 8-22 mins  Wahid Holley 12/23/2016, 11:49 AM  Lesle Chris, OTR/L [phone] 12/23/2016

## 2016-12-23 NOTE — Progress Notes (Signed)
   Subjective: 2 Days Post-Op Procedure(s) (LRB): RIGHT TOTAL KNEE ARTHROPLASTY (Right)  Pt feeling better this morning Still needs moderate assistance when out of the bed Pain is moderate Denies any new symptoms or issues Patient reports pain as moderate.  Objective:   VITALS:   Vitals:   12/22/16 2111 12/23/16 0530  BP: 119/60 (!) 132/57  Pulse: 61 (!) 55  Resp: 18 16  Temp: 98.8 F (37.1 C) 98.3 F (36.8 C)    Right knee incision healing well nv intact distally Dressing in place  Good quad control  LABS  Recent Labs  12/22/16 0537 12/23/16 0459  HGB 10.7* 12.5  HCT 32.4* 36.4  WBC 11.8* 11.9*  PLT 177 177     Recent Labs  12/22/16 0537  NA 138  K 5.1  BUN 17  CREATININE 0.77  GLUCOSE 127*     Assessment/Plan: 2 Days Post-Op Procedure(s) (LRB): RIGHT TOTAL KNEE ARTHROPLASTY (Right) Possible d/c later today vs tomorrow depending on her pain and how she progresses with therapy Call me if she is ready for d/c  Continue PT/OT  Pain management    Merla Riches, MPAS, PA-C  12/23/2016, 7:29 AM

## 2016-12-24 LAB — CBC
HCT: 36.7 % (ref 36.0–46.0)
HEMOGLOBIN: 12.2 g/dL (ref 12.0–15.0)
MCH: 30 pg (ref 26.0–34.0)
MCHC: 33.2 g/dL (ref 30.0–36.0)
MCV: 90.2 fL (ref 78.0–100.0)
PLATELETS: 195 10*3/uL (ref 150–400)
RBC: 4.07 MIL/uL (ref 3.87–5.11)
RDW: 13 % (ref 11.5–15.5)
WBC: 9.6 10*3/uL (ref 4.0–10.5)

## 2016-12-24 MED ORDER — POLYETHYLENE GLYCOL 3350 17 G PO PACK
17.0000 g | PACK | Freq: Every day | ORAL | Status: DC
  Administered 2016-12-24: 17 g via ORAL
  Filled 2016-12-24: qty 1

## 2016-12-24 NOTE — Progress Notes (Signed)
Physical Therapy Treatment Patient Details Name: Holly Tucker MRN: [id number] DOB: 02/23/1930 Today's Date: 12/24/2016    History of Present Illness Pt s/p R TKR and with "bad L knee"    PT Comments    The patient is progressing well. Ready for DC.  Follow Up Recommendations  Home health PT     Equipment Recommendations  Rolling walker with 5" wheels    Recommendations for Other Services       Precautions / Restrictions Precautions Precautions: Fall Precaution Comments: instructed on use of KI Required Braces or Orthoses: Knee Immobilizer - Right Knee Immobilizer - Right: Discontinue once straight leg raise with < 10 degree lag Restrictions Weight Bearing Restrictions: No Other Position/Activity Restrictions: WBAT    Mobility  Bed Mobility Overal bed mobility: Needs Assistance Bed Mobility: Supine to Sit;Sit to Supine     Supine to sit: Min assist Sit to supine: Min assist   General bed mobility comments: min assist for right leg  Transfers Overall transfer level: Needs assistance Equipment used: Rolling walker (2 wheeled) Transfers: Sit to/from Stand Sit to Stand: Supervision;Min guard            Ambulation/Gait Ambulation/Gait assistance: Min assist Ambulation Distance (Feet): 35 Feet Assistive device: Rolling walker (2 wheeled) Gait Pattern/deviations: Step-to pattern     General Gait Details: decreasedbalance posteriorly at times.   Stairs Stairs: Yes   Stair Management: Backwards;Forwards;With walker;One rail Left   General stair comments: practiced 2 with 1 trail and "wall", practiced 3 backward with RW, caregiver present for instruction.  Wheelchair Mobility    Modified Rankin (Stroke Patients Only)       Balance                                    Cognition Arousal/Alertness: Awake/alert Behavior During Therapy: WFL for tasks assessed/performed Overall Cognitive Status: Within Functional Limits for tasks assessed                      Exercises Total Joint Exercises Ankle Circles/Pumps: AROM;Both;20 reps;Supine Quad Sets: AROM;Both;10 reps;Supine Heel Slides: AAROM;Right;10 reps;Supine Hip ABduction/ADduction: AAROM;10 reps;Right Straight Leg Raises: AAROM;Right;10 reps;Supine Goniometric ROM: -10-40    General Comments        Pertinent Vitals/Pain Pain Score: 4  Pain Location: r knee Pain Descriptors / Indicators: Aching;Sore Pain Intervention(s): Monitored during session;Premedicated before session;Repositioned    Home Living                      Prior Function            PT Goals (current goals can now be found in the care plan section) Progress towards PT goals: Progressing toward goals    Frequency    7X/week      PT Plan Current plan remains appropriate    Co-evaluation             End of Session Equipment Utilized During Treatment: Gait belt;Right knee immobilizer Activity Tolerance: Patient limited by fatigue;Patient limited by pain Patient left: in chair;with call bell/phone within reach     Time: 1043-1109 PT Time Calculation (min) (ACUTE ONLY): 26 min  Charges:  $Gait Training: 8-22 mins $Therapeutic Exercise: 8-22 mins                    G Codes:      Claretha Cooper 12/24/2016, 12:51 PM

## 2016-12-24 NOTE — Progress Notes (Signed)
Occupational Therapy Treatment Patient Details Name: Kirsta Harman MRN: [id number] DOB: 10-Jan-1930 Today's Date: 12/24/2016    History of present illness Pt s/p R TKR and with "bad L knee"   OT comments  pts caregiver present for part of OT session .  Follow Up Recommendations  No OT follow up    Equipment Recommendations  None recommended by OT    Recommendations for Other Services      Precautions / Restrictions Precautions Precautions: Fall Precaution Comments: instructed on use of KI Required Braces or Orthoses: Knee Immobilizer - Right Knee Immobilizer - Right: Discontinue once straight leg raise with < 10 degree lag Restrictions Weight Bearing Restrictions: No Other Position/Activity Restrictions: WBAT       Mobility Bed Mobility   Bed Mobility: Supine to Sit     Supine to sit: Min assist Sit to supine: Min assist      Transfers Overall transfer level: Needs assistance Equipment used: Rolling walker (2 wheeled) Transfers: Sit to/from Stand Sit to Stand: Min assist                  ADL Overall ADL's : Needs assistance/impaired     Grooming: Set up;Standing   Upper Body Bathing: Set up;Sitting   Lower Body Bathing: Minimal assistance;Sit to/from stand;Cueing for safety;Cueing for sequencing;Cueing for compensatory techniques   Upper Body Dressing : Set up;Sitting   Lower Body Dressing: Minimal assistance;Cueing for safety;Cueing for sequencing;Sit to/from stand   Toilet Transfer: Minimal assistance;RW;Ambulation;Cueing for sequencing;Cueing for safety   Toileting- Clothing Manipulation and Hygiene: Cueing for safety;Sit to/from stand;Minimal assistance       Functional mobility during ADLs: Minimal assistance;Rolling walker;Cueing for safety;Cueing for sequencing                  Cognition   Behavior During Therapy: WFL for tasks assessed/performed Overall Cognitive Status: Within Functional Limits for tasks assessed                              General Comments      Pertinent Vitals/ Pain       Pain Score: 3  Pain Location: r knee Pain Descriptors / Indicators: Aching;Sore Pain Intervention(s): Monitored during session         Frequency  Min 2X/week        Progress Toward Goals  OT Goals(current goals can now be found in the care plan section)  Progress towards OT goals: Progressing toward goals     Plan Discharge plan remains appropriate       End of Session Equipment Utilized During Treatment: Rolling walker   Activity Tolerance Patient tolerated treatment well   Patient Left in chair   Nurse Communication Mobility status        Time: [id number] OT Time Calculation (min): 18 min  Charges: OT General Charges $OT Visit: 1 Procedure OT Treatments $Self Care/Home Management : 8-22 mins  Indiyah Paone, Thereasa Parkin 12/24/2016, 12:35 PM

## 2016-12-24 NOTE — Progress Notes (Signed)
RN reviewed discharge instructions with patient. All questions answered.   Paperwork and prescriptions given. Patient set up with [facility] for home health PT, and RW and 3in1 provided by [facility].  NT rolled patient down with all belongings to family car.

## 2016-12-24 NOTE — Discharge Summary (Signed)
Physician Discharge Summary  Patient ID: Holly Tucker MRN: [id number] DOB/AGE: 1930-11-15 83 y.o.  Admit date: 12/21/2016 Discharge date: 12/24/2016  Admission Diagnoses: right knee OA  Discharge Diagnoses:  Active Problems:   Primary osteoarthritis of right knee   S/P knee replacement   Discharged Condition: good  Hospital Course:  Holly Tucker is a 83 y.o. who was admitted to [facility]. They were brought to the operating room on 12/21/2016 and underwent Procedure(s): RIGHT TOTAL KNEE ARTHROPLASTY.  Patient tolerated the procedure well and was later transferred to the recovery room and then to the orthopaedic floor for postoperative care.  They were given PO and IV analgesics for pain control following their surgery.  They were given 24 hours of postoperative antibiotics of  Anti-infectives    Start     Dose/Rate Route Frequency Ordered Stop   12/21/16 1400  ceFAZolin (ANCEF) IVPB 2g/100 mL premix     2 g 200 mL/hr over 30 Minutes Intravenous Every 6 hours 12/21/16 1133 12/21/16 2138   12/21/16 0549  ceFAZolin (ANCEF) IVPB 2g/100 mL premix     2 g 200 mL/hr over 30 Minutes Intravenous On call to O.R. 12/21/16 [id number] 12/21/16 0753     and started on DVT prophylaxis in the form of lovenonx.   PT and OT were ordered for total joint protocol.  Discharge planning consulted to help with postop disposition and equipment needs.  Patient had a good night on the evening of surgery and started to get up OOB with therapy on day one.  Hemovac drain was pulled without difficulty.  Continued to work with therapy into day two.  Dressing was with normal limits.  The patient had progressed with therapy and meeting their goals. Patient was seen in rounds and was ready to go home.  Consults: n/a  Significant Diagnostic Studies: routine  Treatments:routine  Discharge Exam: Blood pressure 129/61, pulse 70, temperature 99.2 F (37.3 C), temperature source Oral, resp. rate 16, height 5' 6.5" (1.689  m), weight 84.8 kg (187 lb), SpO2 92 %. Well nourished. Alert and oriented x3. RRR, Lungs clear, BS x4. Abdomen soft and non tender. Right Calf soft and non tender. Right knee dressing C/D/I. No DVT signs. Compartment soft. No signs of infection.  Right LE grossly neurovascular intact.  Disposition: Final discharge disposition not confirmed  Discharge Instructions    Call MD / Call 911    Complete by:  As directed    If you experience chest pain or shortness of breath, CALL 911 and be transported to the hospital emergency room.  If you develope a fever above 101 F, pus (white drainage) or increased drainage or redness at the wound, or calf pain, call your surgeon's office.   Constipation Prevention    Complete by:  As directed    Drink plenty of fluids.  Prune juice may be helpful.  You may use a stool softener, such as Colace (over the counter) 100 mg twice a day.  Use MiraLax (over the counter) for constipation as needed.   Diet - low sodium heart healthy    Complete by:  As directed    Discharge instructions    Complete by:  As directed    INSTRUCTIONS AFTER JOINT REPLACEMENT   Remove items at home which could result in a fall. This includes throw rugs or furniture in walking pathways ICE to the affected joint every three hours while awake for 30 minutes at a time, for at least the first 3-5  days, and then as needed for pain and swelling.  Continue to use ice for pain and swelling. You may notice swelling that will progress down to the foot and ankle.  This is normal after surgery.  Elevate your leg when you are not up walking on it.   Continue to use the breathing machine you got in the hospital (incentive spirometer) which will help keep your temperature down.  It is common for your temperature to cycle up and down following surgery, especially at night when you are not up moving around and exerting yourself.  The breathing machine keeps your lungs expanded and your temperature  down.   DIET:  As you were doing prior to hospitalization, we recommend a well-balanced diet.  DRESSING / WOUND CARE / SHOWERING  Keep the surgical dressing until follow up.  The dressing is water proof, so you can shower without any extra covering.  IF THE DRESSING FALLS OFF or the wound gets wet inside, change the dressing with sterile gauze.  Please use good hand washing techniques before changing the dressing.  Do not use any lotions or creams on the incision until instructed by your surgeon.    ACTIVITY  Increase activity slowly as tolerated, but follow the weight bearing instructions below.   No driving for 6 weeks or until further direction given by your physician.  You cannot drive while taking narcotics.  No lifting or carrying greater than 10 lbs. until further directed by your surgeon. Avoid periods of inactivity such as sitting longer than an hour when not asleep. This helps prevent blood clots.  You may return to work once you are authorized by your doctor.     WEIGHT BEARING   Weight bearing as tolerated with assist device (walker, cane, etc) as directed, use it as long as suggested by your surgeon or therapist, typically at least 4-6 weeks.   EXERCISES  Results after joint replacement surgery are often greatly improved when you follow the exercise, range of motion and muscle strengthening exercises prescribed by your doctor. Safety measures are also important to protect the joint from further injury. Any time any of these exercises cause you to have increased pain or swelling, decrease what you are doing until you are comfortable again and then slowly increase them. If you have problems or questions, call your caregiver or physical therapist for advice.   Rehabilitation is important following a joint replacement. After just a few days of immobilization, the muscles of the leg can become weakened and shrink (atrophy).  These exercises are designed to build up the tone and  strength of the thigh and leg muscles and to improve motion. Often times heat used for twenty to thirty minutes before working out will loosen up your tissues and help with improving the range of motion but do not use heat for the first two weeks following surgery (sometimes heat can increase post-operative swelling).   These exercises can be done on a training (exercise) mat, on the floor, on a table or on a bed. Use whatever works the best and is most comfortable for you.    Use music or television while you are exercising so that the exercises are a pleasant break in your day. This will make your life better with the exercises acting as a break in your routine that you can look forward to.   Perform all exercises about fifteen times, three times per day or as directed.  You should exercise both the operative leg  and the other leg as well.   Exercises include:   Quad Sets - Tighten up the muscle on the front of the thigh (Quad) and hold for 5-10 seconds.   Straight Leg Raises - With your knee straight (if you were given a brace, keep it on), lift the leg to 60 degrees, hold for 3 seconds, and slowly lower the leg.  Perform this exercise against resistance later as your leg gets stronger.  Leg Slides: Lying on your back, slowly slide your foot toward your buttocks, bending your knee up off the floor (only go as far as is comfortable). Then slowly slide your foot back down until your leg is flat on the floor again.  Angel Wings: Lying on your back spread your legs to the side as far apart as you can without causing discomfort.  Hamstring Strength:  Lying on your back, push your heel against the floor with your leg straight by tightening up the muscles of your buttocks.  Repeat, but this time bend your knee to a comfortable angle, and push your heel against the floor.  You may put a pillow under the heel to make it more comfortable if necessary.   A rehabilitation program following joint replacement  surgery can speed recovery and prevent re-injury in the future due to weakened muscles. Contact your doctor or a physical therapist for more information on knee rehabilitation.    CONSTIPATION  Constipation is defined medically as fewer than three stools per week and severe constipation as less than one stool per week.  Even if you have a regular bowel pattern at home, your normal regimen is likely to be disrupted due to multiple reasons following surgery.  Combination of anesthesia, postoperative narcotics, change in appetite and fluid intake all can affect your bowels.   YOU MUST use at least one of the following options; they are listed in order of increasing strength to get the job done.  They are all available over the counter, and you may need to use some, POSSIBLY even all of these options:    Drink plenty of fluids (prune juice may be helpful) and high fiber foods Colace 100 mg by mouth twice a day  Senokot for constipation as directed and as needed Dulcolax (bisacodyl), take with full glass of water  Miralax (polyethylene glycol) once or twice a day as needed.  If you have tried all these things and are unable to have a bowel movement in the first 3-4 days after surgery call either your surgeon or your primary doctor.    If you experience loose stools or diarrhea, hold the medications until you stool forms back up.  If your symptoms do not get better within 1 week or if they get worse, check with your doctor.  If you experience "the worst abdominal pain ever" or develop nausea or vomiting, please contact the office immediately for further recommendations for treatment.   ITCHING:  If you experience itching with your medications, try taking only a single pain pill, or even half a pain pill at a time.  You can also use Benadryl over the counter for itching or also to help with sleep.   TED HOSE STOCKINGS:  Use stockings on both legs until for at least 2 weeks or as directed by physician  office. They may be removed at night for sleeping.  MEDICATIONS:  See your medication summary on the "After Visit Summary" that nursing will review with you.  You may have some home  medications which will be placed on hold until you complete the course of blood thinner medication.  It is important for you to complete the blood thinner medication as prescribed.  PRECAUTIONS:  If you experience chest pain or shortness of breath - call 911 immediately for transfer to the hospital emergency department.   If you develop a fever greater that 101 F, purulent drainage from wound, increased redness or drainage from wound, foul odor from the wound/dressing, or calf pain - CONTACT YOUR SURGEON.                                                   FOLLOW-UP APPOINTMENTS:  If you do not already have a post-op appointment, please call the office for an appointment to be seen by your surgeon.  Guidelines for how soon to be seen are listed in your "After Visit Summary", but are typically between 1-4 weeks after surgery.  OTHER INSTRUCTIONS:   Knee Replacement:  Do not place pillow under knee, focus on keeping the knee straight while resting. CPM instructions: 0-90 degrees, 2 hours in the morning, 2 hours in the afternoon, and 2 hours in the evening. Place foam block, curve side up under heel at all times except when in CPM or when walking.  DO NOT modify, tear, cut, or change the foam block in any way.  MAKE SURE YOU:  Understand these instructions.  Get help right away if you are not doing well or get worse.    Thank you for letting us be a part of your medical care team.  It is a privilege we respect greatly.  We hope these instructions will help you stay on track for a fast and full recovery!   Increase activity slowly as tolerated    Complete by:  As directed      Allergies as of 12/24/2016      Reactions   Sulfa Antibiotics Hives, Itching, Swelling      Medication List    TAKE these medications    amitriptyline 50 MG tablet Commonly known as:  ELAVIL Take 100 mg by mouth at bedtime.   aspirin EC 325 MG tablet Take 1 tablet (325 mg total) by mouth 2 (two) times daily.   baclofen 10 MG tablet Commonly known as:  LIORESAL Take 1 tablet (10 mg total) by mouth 3 (three) times daily.   BIOTIN 5000 PO Take 1 tablet by mouth daily.   CoQ-10 200 MG Caps Take 1 capsule by mouth daily.   gabapentin 300 MG capsule Commonly known as:  NEURONTIN Take 300 mg by mouth 3 (three) times daily.   GLUCOSAMINE-CHONDROITIN PO Take 1 tablet by mouth 2 (two) times daily.   meloxicam 15 MG tablet Commonly known as:  MOBIC Take 15 mg by mouth daily. STOPPED PRIOR TO PROCEDURE   multivitamin with minerals Tabs tablet Take 1 tablet by mouth daily.   oxyCODONE 5 MG immediate release tablet Commonly known as:  ROXICODONE Take 1-2 tablets (5-10 mg total) by mouth every 4 (four) hours as needed for severe pain.            Durable Medical Equipment        Start     Ordered   12/22/16 1024  For home use only DME Walker rolling  Once    Question:  Patient needs  a walker to treat with the following condition  Answer:  S/P total knee arthroplasty, right   12/22/16 1024     Follow-up Information    [facility] Follow up.   Specialty:  [facility] Why:  Home Health physical therapy Contact information: [address] [place] [address] [phone]           Signed: Lajean Manes 12/24/2016, 7:47 AM

## 2016-12-24 NOTE — Progress Notes (Signed)
Subjective: 3 Days Post-Op Procedure(s) (LRB): RIGHT TOTAL KNEE ARTHROPLASTY (Right) Patient reports pain as well contrtolled.  Tolerating Po's. No BM, but flatus. Progressing with PT.Reports she is ready to D/c. Denies CP, SOB, or calf pain.  Objective: Vital signs in last 24 hours: Temp:  [97.6 F (36.4 C)-99.4 F (37.4 C)] 99.2 F (37.3 C) (02/05 0636) Pulse Rate:  [61-71] 70 (02/05 0636) Resp:  [16] 16 (02/05 0636) BP: (129-142)/(46-61) 129/61 (02/05 0636) SpO2:  [92 %-99 %] 92 % (02/05 0636)  Intake/Output from previous day: 02/04 0701 - 02/05 0700 In: 715 [P.O.:640; I.V.:75] Out: 900 [Urine:900] Intake/Output this shift: Total I/O In: -  Out: 100 [Urine:100]   Recent Labs  12/22/16 0537 12/23/16 0459 12/24/16 0416  HGB 10.7* 12.5 12.2    Recent Labs  12/23/16 0459 12/24/16 0416  WBC 11.9* 9.6  RBC 4.00 4.07  HCT 36.4 36.7  PLT 177 195    Recent Labs  12/22/16 0537  NA 138  K 5.1  CL 107  CO2 26  BUN 17  CREATININE 0.77  GLUCOSE 127*  CALCIUM 8.4*   No results for input(s): LABPT, INR in the last 72 hours.  Well nourished. Alert and oriented x3. RRR, Lungs clear, BS x4. Abdomen soft and non tender. Right Calf soft and non tender. Right knee dressing C/D/I. No DVT signs. Compartment soft. No signs of infection.  Right LE grossly neurovascular intact.  Assessment/Plan: 3 Days Post-Op Procedure(s) (LRB): RIGHT TOTAL KNEE ARTHROPLASTY (Right) D/c home with family and HHPT Up with PT this am Follow instructions F/u in office in 2 weeks ASA 325mg  BID  Constipation: Miralax this am Continue colace Hydration  Holly Tucker L 12/24/2016, 7:44 AM

## 2016-12-31 ENCOUNTER — Ambulatory Visit: Payer: Medicare Other | Admitting: Physical Therapy

## 2017-01-03 ENCOUNTER — Ambulatory Visit: Payer: Medicare Other | Attending: Specialist | Admitting: Physical Therapy

## 2017-01-03 DIAGNOSIS — R600 Localized edema: Secondary | ICD-10-CM | POA: Diagnosis present

## 2017-01-03 DIAGNOSIS — M25562 Pain in left knee: Secondary | ICD-10-CM | POA: Diagnosis present

## 2017-01-03 DIAGNOSIS — M25661 Stiffness of right knee, not elsewhere classified: Secondary | ICD-10-CM

## 2017-01-03 NOTE — Therapy (Signed)
[facility] [place], [place], [address] Phone: [phone]   Fax:  [phone]  Physical Therapy Evaluation  Patient Details  Name: Holly Tucker MRN: [id number] Date of Birth: 05-10-1930 Referring Provider: Sydnee Cabal MD.  Encounter Date: 01/03/2017      PT End of Session - 01/03/17 1827    Visit Number 1   Number of Visits 12   Date for PT Re-Evaluation 03/04/17   PT Start Time 0117   PT Stop Time 0202   PT Time Calculation (min) 45 min   Activity Tolerance Patient tolerated treatment well   Behavior During Therapy [facility] for tasks assessed/performed      Past Medical History:  Diagnosis Date  . Arthritis   . Pneumonia     Past Surgical History:  Procedure Laterality Date  . ABDOMINAL HYSTERECTOMY    . APPENDECTOMY     taken when had hysterectomy  . EYE SURGERY     bilateral cataract surgery with lens implants  . TONSILLECTOMY     age 31  . TOTAL KNEE ARTHROPLASTY Right 12/21/2016   Procedure: RIGHT TOTAL KNEE ARTHROPLASTY;  Surgeon: Sydnee Cabal, MD;  Location: [facility] ORS;  Service: Orthopedics;  Laterality: Right;    There were no vitals filed for this visit.       Subjective Assessment - 01/03/17 1846    Subjective The patient underwent a right total knee replacement on 12/21/16.  She still has her post-surgical dressing intact.  She presented to the clinic today ambulating with a FWW.  She had home health physical therapy and is compliant to her HEP.  She rates her resting pain-level at a 4/10 today.  Ice and elevation decreases her pain and Bending her knee increases her pain. Patient states she is no longer taking pain medication.            [facility] PT Assessment - 01/03/17 0001      Assessment   Medical Diagnosis --  Right total knee replacement.   Referring Provider Sydnee Cabal MD.   Onset Date/Surgical Date --  12/21/16 (surgery date).   Next MD Visit --  01/04/17.     Precautions   Precautions --  No ultrasound.     Restrictions   Weight Bearing Restrictions No     Balance Screen   Has the patient fallen in the past 6 months Yes   How many times? --  3.   Has the patient had a decrease in activity level because of a fear of falling?  Yes   Is the patient reluctant to leave their home because of a fear of falling?  Yes     Prior Function   Level of Independence Independent     Observation/Other Assessments   Observations --  Post-surgical dressing (Aquacel) on right anterior knee.     Observation/Other Assessments-Edema    Edema Circumferential     Circumferential Edema   Circumferential - Right RT 5 cms > LT.     ROM / Strength   AROM / PROM / Strength AROM;Strength     AROM   Overall AROM Comments Right knee extension activiely= -10 degrees and passive= -5 degrees with right knee active flexion= 87 degrees.     Strength   Overall Strength Comments Right knee strength= 4-/5 and right knee= 4/5.     Palpation   Palpation comment Tender to palpation over right knee media;l an dlateral joint line.     Ambulation/Gait  Assistive device Rolling walker   Gait Pattern Step-to pattern;Decreased stance time - right;Antalgic                             PT Short Term Goals - 01/03/17 1848      PT SHORT TERM GOAL #1   Title Full active right knee extension.   Time 2   Period Weeks   Status New           PT Long Term Goals - 01/03/17 1849      PT LONG TERM GOAL #1   Title Independent with a HEP.   Time 8   Period Weeks   Status New     PT LONG TERM GOAL #2   Title Active right knee flexion to 115 degrees+ so the patient can perform functional tasks and do so with pain not > 2-3/10.   Time 8   Period Weeks   Status New     PT LONG TERM GOAL #3   Title Increase right knee strength to a solid 4+/5 to provide good stability for accomplishment of functional activities   Time 8   Period Weeks   Status New     PT  LONG TERM GOAL #4   Title Decrease edema to within 2.5 cms of non-affected side to assist with pain reduction and range of motion gains.   Time 8   Period Weeks   Status New     PT LONG TERM GOAL #5   Title Perform a reciprocating stair gait with one railing with pain not > 2-3/10.   Time 8   Period Weeks   Status New               Plan - 01/03/17 1843    Clinical Impression Statement The patient presents to OPPT 15 days s/p right total knee replacement.  She lacks both flexion and extension currently and demonstrates a significant amount of edema.  She is ambulating safely with a FWW.  She is very motivated to improve as she walks to drive again.  She will benefit from skilled physical therapy to include ROM, strengthening, electrical stimulation and vasopneumatic to control pain and swelling.   Rehab Potential Excellent   PT Frequency 3x / week   PT Duration 4 weeks   PT Treatment/Interventions ADLs/Self Care Home Management;Cryotherapy;Electrical Stimulation;Functional mobility training;Stair training;Gait training;Therapeutic activities;Therapeutic exercise;Neuromuscular re-education;Patient/family education;Passive range of motion;Manual techniques;Vasopneumatic Device   PT Next Visit Plan Nustep; PROM; Rockerboard; progress into total knee replacement protocol.  No ultrasound.  Vasopneumatic and electrical stimulation.   Consulted and Agree with Plan of Care Patient      Patient will benefit from skilled therapeutic intervention in order to improve the following deficits and impairments:  Decreased activity tolerance, Decreased strength, Increased edema, Pain, Decreased range of motion, Abnormal gait  Visit Diagnosis: Acute pain of left knee - Plan: PT plan of care cert/re-cert  Stiffness of right knee, not elsewhere classified - Plan: PT plan of care cert/re-cert  Localized edema - Plan: PT plan of care cert/re-cert      G-Codes - [id number]    Functional  Assessment Tool Used FOTO 66% limitation.   Functional Limitation Mobility: Walking and moving around   Mobility: Walking and Moving Around Current Status [id number]) At least 60 percent but less than 80 percent impaired, limited or restricted   Mobility: Walking and Moving Around Goal Status [id number]) At least 20  percent but less than 40 percent impaired, limited or restricted       Problem List Patient Active Problem List   Diagnosis Date Noted  . Primary osteoarthritis of right knee 12/21/2016  . S/P knee replacement 12/21/2016    Chaundra Abreu, [place] MPT 01/03/2017, 6:52 PM  [facility] [address] [place], [place], [address] Phone: [phone]   Fax:  [phone]  Name: Holly Tucker MRN: [id number] Date of Birth: December 20, 1929

## 2017-01-07 ENCOUNTER — Ambulatory Visit: Payer: Medicare Other | Admitting: Physical Therapy

## 2017-01-07 DIAGNOSIS — M25661 Stiffness of right knee, not elsewhere classified: Secondary | ICD-10-CM

## 2017-01-07 DIAGNOSIS — R600 Localized edema: Secondary | ICD-10-CM

## 2017-01-07 DIAGNOSIS — M25562 Pain in left knee: Secondary | ICD-10-CM | POA: Diagnosis not present

## 2017-01-07 NOTE — Therapy (Signed)
[facility] [place], [place], [address] Phone: [phone]   Fax:  [phone]  Physical Therapy Treatment  Patient Details  Name: Holly Tucker MRN: [id number] Date of Birth: 02/23/1930 Referring Provider: Sydnee Cabal MD.  Encounter Date: 01/07/2017      PT End of Session - 01/07/17 1751    Visit Number 2   Number of Visits 12   Date for PT Re-Evaluation 03/04/17   PT Start Time 0232   PT Stop Time 0322   PT Time Calculation (min) 50 min   Activity Tolerance Patient tolerated treatment well   Behavior During Therapy [facility] for tasks assessed/performed      Past Medical History:  Diagnosis Date  . Arthritis   . Pneumonia     Past Surgical History:  Procedure Laterality Date  . ABDOMINAL HYSTERECTOMY    . APPENDECTOMY     taken when had hysterectomy  . EYE SURGERY     bilateral cataract surgery with lens implants  . TONSILLECTOMY     age 83  . TOTAL KNEE ARTHROPLASTY Right 12/21/2016   Procedure: RIGHT TOTAL KNEE ARTHROPLASTY;  Surgeon: Sydnee Cabal, MD;  Location: [facility];  Service: Orthopedics;  Laterality: Right;    There were no vitals filed for this visit.      Subjective Assessment - 01/07/17 1752    Subjective I can't wait to get off this walker.  I also don't want to wear these compression socks but the Dr. told me one more week because of my swelling.   Pain Score 4    Pain Location Knee   Pain Orientation Right   Pain Descriptors / Indicators Aching;Sore   Pain Type Surgical pain   Pain Onset 1 to 4 weeks ago                         [facility] Adult PT Treatment/Exercise - 01/07/17 0001      Exercises   Exercises Knee/Hip;Ankle     Knee/Hip Exercises: Aerobic   Nustep Level 3 x 15 minutes with rigth foot strapped to stay on pedal moving forward x 3 to increase flexion.     Knee/Hip Exercises: Seated   Long Arc Quad Limitations 1# x 4 minutes.     Modalities   Modalities  Passenger transport manager Location RT KNEE.   Electrical Stimulation Action IFC   Electrical Stimulation Parameters 1-10 HZ x 20 minutes.   Electrical Stimulation Goals Edema;Pain     Vasopneumatic   Number Minutes Vasopneumatic  15 minutes   Vasopnuematic Location  --  RT KNEE.   Vasopneumatic Pressure Medium     Ankle Exercises: Standing   Other Standing Ankle Exercises Rockerboard x 4 minutes.                  PT Short Term Goals - 01/03/17 1848      PT SHORT TERM GOAL #1   Title Full active right knee extension.   Time 2   Period Weeks   Status New           PT Long Term Goals - 01/03/17 1849      PT LONG TERM GOAL #1   Title Independent with a HEP.   Time 8   Period Weeks   Status New     PT LONG TERM GOAL #2   Title Active right knee flexion to 115  degrees+ so the patient can perform functional tasks and do so with pain not > 2-3/10.   Time 8   Period Weeks   Status New     PT LONG TERM GOAL #3   Title Increase right knee strength to a solid 4+/5 to provide good stability for accomplishment of functional activities   Time 8   Period Weeks   Status New     PT LONG TERM GOAL #4   Title Decrease edema to within 2.5 cms of non-affected side to assist with pain reduction and range of motion gains.   Time 8   Period Weeks   Status New     PT LONG TERM GOAL #5   Title Perform a reciprocating stair gait with one railing with pain not > 2-3/10.   Time 8   Period Weeks   Status New             Patient will benefit from skilled therapeutic intervention in order to improve the following deficits and impairments:  Decreased activity tolerance, Decreased strength, Increased edema, Pain, Decreased range of motion, Abnormal gait  Visit Diagnosis: Acute pain of left knee  Stiffness of right knee, not elsewhere classified  Localized edema     Problem List Patient Active  Problem List   Diagnosis Date Noted  . Primary osteoarthritis of right knee 12/21/2016  . S/P knee replacement 12/21/2016    Holly Tucker, [place] MPT 01/07/2017, 5:59 PM  [facility] [place], [place], [address] Phone: [phone]   Fax:  [phone]  Name: Holly Tucker MRN: [id number] Date of Birth: [birth year]

## 2017-01-09 ENCOUNTER — Encounter: Payer: Self-pay | Admitting: Physical Therapy

## 2017-01-09 ENCOUNTER — Ambulatory Visit: Payer: Medicare Other | Admitting: Physical Therapy

## 2017-01-09 DIAGNOSIS — R600 Localized edema: Secondary | ICD-10-CM

## 2017-01-09 DIAGNOSIS — M25562 Pain in left knee: Secondary | ICD-10-CM | POA: Diagnosis not present

## 2017-01-09 DIAGNOSIS — M25661 Stiffness of right knee, not elsewhere classified: Secondary | ICD-10-CM

## 2017-01-09 NOTE — Therapy (Signed)
[facility] [place], [place], [address] Phone: [phone]   Fax:  [phone]  Physical Therapy Treatment  Patient Details  Name: Holly Tucker MRN: [id number] Date of Birth: [birth year] Referring Provider: Sydnee Cabal MD.  Encounter Date: 01/09/2017      PT End of Session - 01/09/17 1355    Visit Number 3   Number of Visits 12   Date for PT Re-Evaluation 03/04/17   PT Start Time [id number]   PT Stop Time 1447   PT Time Calculation (min) 52 min   Activity Tolerance Patient tolerated treatment well   Behavior During Therapy [facility] for tasks assessed/performed      Past Medical History:  Diagnosis Date  . Arthritis   . Pneumonia     Past Surgical History:  Procedure Laterality Date  . ABDOMINAL HYSTERECTOMY    . APPENDECTOMY     taken when had hysterectomy  . EYE SURGERY     bilateral cataract surgery with lens implants  . TONSILLECTOMY     age 83  . TOTAL KNEE ARTHROPLASTY Right 12/21/2016   Procedure: RIGHT TOTAL KNEE ARTHROPLASTY;  Surgeon: Sydnee Cabal, MD;  Location: [facility];  Service: Orthopedics;  Laterality: Right;    There were no vitals filed for this visit.      Subjective Assessment - 01/09/17 1354    Subjective Reports tightness and swelling in R calf and RLE respectively. Reports she is to get rid of compression socks tomorrow. Reports that she hasn't taken pain pills in approximately 5 days.   Pertinent History Left knee pain.   Limitations Walking   How long can you walk comfortably? Short distances with a FWW.   Patient [place].   Currently in Pain? Yes   Pain Score 2    Pain Location Knee   Pain Orientation Right   Pain Descriptors / Indicators Tightness   Pain Type Surgical pain   Pain Onset 1 to 4 weeks ago            [facility] PT Assessment - 01/09/17 0001      Assessment   Medical Diagnosis R TKR   Onset Date/Surgical Date 12/21/16   Next MD Visit 02/01/2017      Restrictions   Weight Bearing Restrictions No                     [facility] Adult PT Treatment/Exercise - 01/09/17 0001      Knee/Hip Exercises: Aerobic   Nustep L4, seat 10 x13 min     Knee/Hip Exercises: Standing   Hip Flexion AROM;Right;2 sets;10 reps;Knee bent   Hip Abduction AROM;Right;2 sets;10 reps;Knee bent   Rocker Board 2 minutes     Knee/Hip Exercises: Supine   Short Arc Quad Sets Strengthening;Right;2 sets;10 reps   Short Arc Quad Sets Limitations 2#     Modalities   Modalities Financial risk analyst IFC   Electrical Stimulation Parameters 1-10 hz x15 min   Electrical Stimulation Goals Edema;Pain     Vasopneumatic   Number Minutes Vasopneumatic  15 minutes   Vasopnuematic Location  Knee   Vasopneumatic Pressure Medium   Vasopneumatic Temperature  34     Manual Therapy   Manual Therapy Passive ROM;Soft tissue mobilization   Soft tissue mobilization R patella mobilizations in sup/inf, med/lat to promote proper mobility   Passive ROM PROM of R  knee into flex/ext with gentle holds at end to promote proper range                  PT Short Term Goals - 01/03/17 1848      PT SHORT TERM GOAL #1   Title Full active right knee extension.   Time 2   Period Weeks   Status New           PT Long Term Goals - 01/03/17 1849      PT LONG TERM GOAL #1   Title Independent with a HEP.   Time 8   Period Weeks   Status New     PT LONG TERM GOAL #2   Title Active right knee flexion to 115 degrees+ so the patient can perform functional tasks and do so with pain not > 2-3/10.   Time 8   Period Weeks   Status New     PT LONG TERM GOAL #3   Title Increase right knee strength to a solid 4+/5 to provide good stability for accomplishment of functional activities   Time 8   Period Weeks   Status New     PT LONG TERM GOAL #4   Title  Decrease edema to within 2.5 cms of non-affected side to assist with pain reduction and range of motion gains.   Time 8   Period Weeks   Status New     PT LONG TERM GOAL #5   Title Perform a reciprocating stair gait with one railing with pain not > 2-3/10.   Time 8   Period Weeks   Status New               Plan - 01/09/17 1436    Clinical Impression Statement Patient tolerated today's treatment fairly well as she did not report any increased R knee pain during treatment. Patient guided through knee stretches and ROM exercises without complaint from patient. Patient did experience difficulty with SLS on LLE for hip strengthening exercises due to pain. Fairly good R patella mobility which may be limited secondary to edema. Normal modalities response noted following removal of the modalities.   Rehab Potential Excellent   PT Frequency 3x / week   PT Duration 4 weeks   PT Treatment/Interventions ADLs/Self Care Home Management;Cryotherapy;Electrical Stimulation;Functional mobility training;Stair training;Gait training;Therapeutic activities;Therapeutic exercise;Neuromuscular re-education;Patient/family education;Passive range of motion;Manual techniques;Vasopneumatic Device   PT Next Visit Plan Nustep; PROM; Rockerboard; progress into total knee replacement protocol.  No ultrasound.  Vasopneumatic and electrical stimulation.   Consulted and Agree with Plan of Care Patient      Patient will benefit from skilled therapeutic intervention in order to improve the following deficits and impairments:  Decreased activity tolerance, Decreased strength, Increased edema, Pain, Decreased range of motion, Abnormal gait  Visit Diagnosis: Acute pain of left knee  Stiffness of right knee, not elsewhere classified  Localized edema     Problem List Patient Active Problem List   Diagnosis Date Noted  . Primary osteoarthritis of right knee 12/21/2016  . S/P knee replacement 12/21/2016     Wynelle Fanny, PTA 01/09/2017, 3:28 PM  [facility] [address] [place], [place], [address] Phone: [phone]   Fax:  [phone]  Name: Holly Tucker MRN: [id number] Date of Birth: [birth year]

## 2017-01-10 ENCOUNTER — Encounter: Payer: Self-pay | Admitting: Physical Therapy

## 2017-01-10 ENCOUNTER — Ambulatory Visit: Payer: Medicare Other | Admitting: Physical Therapy

## 2017-01-10 DIAGNOSIS — R600 Localized edema: Secondary | ICD-10-CM

## 2017-01-10 DIAGNOSIS — M25661 Stiffness of right knee, not elsewhere classified: Secondary | ICD-10-CM

## 2017-01-10 DIAGNOSIS — M25562 Pain in left knee: Secondary | ICD-10-CM

## 2017-01-10 NOTE — Therapy (Signed)
[facility] [place], [place], [address] Phone: [phone]   Fax:  [phone]  Physical Therapy Treatment  Patient Details  Name: Holly Tucker MRN: [id number] Date of Birth: Jan 06, 1930 Referring Provider: Sydnee Cabal MD.  Encounter Date: 01/10/2017      PT End of Session - 01/10/17 1355    Visit Number 4   Number of Visits 12   Date for PT Re-Evaluation 03/04/17   PT Start Time [id number]   PT Stop Time 1448   PT Time Calculation (min) 56 min   Activity Tolerance Patient tolerated treatment well   Behavior During Therapy [facility] for tasks assessed/performed      Past Medical History:  Diagnosis Date  . Arthritis   . Pneumonia     Past Surgical History:  Procedure Laterality Date  . ABDOMINAL HYSTERECTOMY    . APPENDECTOMY     taken when had hysterectomy  . EYE SURGERY     bilateral cataract surgery with lens implants  . TONSILLECTOMY     age 83  . TOTAL KNEE ARTHROPLASTY Right 12/21/2016   Procedure: RIGHT TOTAL KNEE ARTHROPLASTY;  Surgeon: Sydnee Cabal, MD;  Location: [facility];  Service: Orthopedics;  Laterality: Right;    There were no vitals filed for this visit.      Subjective Assessment - 01/10/17 1354    Subjective Reports that her knee is a little better.   Pertinent History Left knee pain.   Limitations Walking   How long can you walk comfortably? Short distances with a FWW.   Patient [place].   Currently in Pain? No/denies            [facility] PT Assessment - 01/10/17 0001      Assessment   Medical Diagnosis R TKR   Onset Date/Surgical Date 12/21/16   Next MD Visit 02/01/2017     Restrictions   Weight Bearing Restrictions No                     [facility] Adult PT Treatment/Exercise - 01/10/17 0001      Knee/Hip Exercises: Aerobic   Nustep L5, seat 10-8 x12 min     Knee/Hip Exercises: Standing   Forward Lunges Right;3 sets;10 reps;3 seconds   Forward Step Up Right;1  set;10 reps;Hand Hold: 2;Step Height: 6"   Rocker Board 3 minutes     Knee/Hip Exercises: Supine   Short Arc Quad Sets Strengthening;Right;2 sets;10 reps   Short Arc Quad Sets Limitations 3#     Modalities   Modalities Water quality scientist R knee   Chartered certified accountant IFC   Electrical Stimulation Parameters 1-10 hz x15 min   Electrical Stimulation Goals Edema;Pain     Vasopneumatic   Number Minutes Vasopneumatic  15 minutes   Vasopnuematic Location  Knee   Vasopneumatic Pressure Medium   Vasopneumatic Temperature  50     Manual Therapy   Manual Therapy Passive ROM   Passive ROM PROM of R knee into flex/ext with gentle holds at end to promote proper range                  PT Short Term Goals - 01/03/17 1848      PT SHORT TERM GOAL #1   Title Full active right knee extension.   Time 2   Period Weeks   Status New  PT Long Term Goals - 01/03/17 1849      PT LONG TERM GOAL #1   Title Independent with a HEP.   Time 8   Period Weeks   Status New     PT LONG TERM GOAL #2   Title Active right knee flexion to 115 degrees+ so the patient can perform functional tasks and do so with pain not > 2-3/10.   Time 8   Period Weeks   Status New     PT LONG TERM GOAL #3   Title Increase right knee strength to a solid 4+/5 to provide good stability for accomplishment of functional activities   Time 8   Period Weeks   Status New     PT LONG TERM GOAL #4   Title Decrease edema to within 2.5 cms of non-affected side to assist with pain reduction and range of motion gains.   Time 8   Period Weeks   Status New     PT LONG TERM GOAL #5   Title Perform a reciprocating stair gait with one railing with pain not > 2-3/10.   Time 8   Period Weeks   Status New               Plan - 01/10/17 1439    Clinical Impression Statement Patient tolerated today's treatment  well today as she had no complaints upon beginning of treatment. Patient able to complete exercises as directed without complaint. Patient required minimal cueing for erect posture with forward step ups and cueing for proper sequencing. Good R knee ROM noted with PROM of R knee today. Normal modalities response noted following removal of the modalities.   Rehab Potential Excellent   PT Frequency 3x / week   PT Duration 4 weeks   PT Treatment/Interventions ADLs/Self Care Home Management;Cryotherapy;Electrical Stimulation;Functional mobility training;Stair training;Gait training;Therapeutic activities;Therapeutic exercise;Neuromuscular re-education;Patient/family education;Passive range of motion;Manual techniques;Vasopneumatic Device   PT Next Visit Plan Nustep; PROM; Rockerboard; progress into total knee replacement protocol.  No ultrasound.  Vasopneumatic and electrical stimulation.   Consulted and Agree with Plan of Care Patient      Patient will benefit from skilled therapeutic intervention in order to improve the following deficits and impairments:  Decreased activity tolerance, Decreased strength, Increased edema, Pain, Decreased range of motion, Abnormal gait  Visit Diagnosis: Acute pain of left knee  Stiffness of right knee, not elsewhere classified  Localized edema     Problem List Patient Active Problem List   Diagnosis Date Noted  . Primary osteoarthritis of right knee 12/21/2016  . S/P knee replacement 12/21/2016    Wynelle Fanny, PTA 01/10/2017, 3:15 PM  [facility] [address] [place], [place], [address] Phone: [phone]   Fax:  [phone]  Name: Holly Tucker MRN: [id number] Date of Birth: 1930/10/10

## 2017-01-14 ENCOUNTER — Ambulatory Visit: Payer: Medicare Other | Admitting: Physical Therapy

## 2017-01-14 DIAGNOSIS — R600 Localized edema: Secondary | ICD-10-CM

## 2017-01-14 DIAGNOSIS — M25661 Stiffness of right knee, not elsewhere classified: Secondary | ICD-10-CM

## 2017-01-14 DIAGNOSIS — M25562 Pain in left knee: Secondary | ICD-10-CM | POA: Diagnosis not present

## 2017-01-14 NOTE — Therapy (Signed)
[facility] [place], [place], [address] Phone: [phone]   Fax:  [phone]  Physical Therapy Treatment  Patient Details  Name: Holly Tucker MRN: [id number] Date of Birth: 01-07-1930 Referring Provider: Sydnee Cabal MD.  Encounter Date: 01/14/2017      PT End of Session - 01/14/17 1039    Visit Number 5   Number of Visits 12   Date for PT Re-Evaluation 03/04/17   PT Start Time [id number]   PT Stop Time 1135   PT Time Calculation (min) 60 min   Activity Tolerance Patient tolerated treatment well   Behavior During Therapy [facility] for tasks assessed/performed      Past Medical History:  Diagnosis Date  . Arthritis   . Pneumonia     Past Surgical History:  Procedure Laterality Date  . ABDOMINAL HYSTERECTOMY    . APPENDECTOMY     taken when had hysterectomy  . EYE SURGERY     bilateral cataract surgery with lens implants  . TONSILLECTOMY     age 44  . TOTAL KNEE ARTHROPLASTY Right 12/21/2016   Procedure: RIGHT TOTAL KNEE ARTHROPLASTY;  Surgeon: Sydnee Cabal, MD;  Location: [facility];  Service: Orthopedics;  Laterality: Right;    There were no vitals filed for this visit.      Subjective Assessment - 01/14/17 1039    Subjective Patient states she doesn't have much pain except with bending.   Pertinent History Left knee pain.   Limitations Walking   How long can you walk comfortably? Short distances with a FWW.   Patient [name].   Currently in Pain? No/denies                         [facility] Adult PT Treatment/Exercise - 01/14/17 0001      Ambulation/Gait   Ambulation/Gait Yes   Ambulation/Gait Assistance 4: Min guard   Ambulation Distance (Feet) 40 Feet   Assistive device Straight cane   Gait Pattern Decreased dorsiflexion - right;Decreased stance time - right   Ambulation Surface Level   Gait Comments Able to correct deficits easily using RW     Knee/Hip Exercises: Aerobic   Stationary Bike partial revolutions x 12 min     Knee/Hip Exercises: Standing   Forward Lunges Right;3 sets;10 reps;3 seconds   Rocker Board 3 minutes     Modalities   Modalities Electrical Stimulation;Vasopneumatic     Acupuncturist Location R knee   Electrical Stimulation Action IFC   Electrical Stimulation Parameters 1-10 Hz x 15 min   Electrical Stimulation Goals Edema     Vasopneumatic   Number Minutes Vasopneumatic  15 minutes   Vasopnuematic Location  Knee   Vasopneumatic Pressure Medium   Vasopneumatic Temperature  34     Manual Therapy   Manual Therapy Soft tissue mobilization   Soft tissue mobilization to R ITB and lateral HS in [facility]                  PT Short Term Goals - 01/03/17 1848      PT SHORT TERM GOAL #1   Title Full active right knee extension.   Time 2   Period Weeks   Status New           PT Long Term Goals - 01/03/17 1849      PT LONG TERM GOAL #1   Title Independent with a HEP.   Time  8   Period Weeks   Status New     PT LONG TERM GOAL #2   Title Active right knee flexion to 115 degrees+ so the patient can perform functional tasks and do so with pain not > 2-3/10.   Time 8   Period Weeks   Status New     PT LONG TERM GOAL #3   Title Increase right knee strength to a solid 4+/5 to provide good stability for accomplishment of functional activities   Time 8   Period Weeks   Status New     PT LONG TERM GOAL #4   Title Decrease edema to within 2.5 cms of non-affected side to assist with pain reduction and range of motion gains.   Time 8   Period Weeks   Status New     PT LONG TERM GOAL #5   Title Perform a reciprocating stair gait with one railing with pain not > 2-3/10.   Time 8   Period Weeks   Status New               Plan - 01/14/17 1233    Clinical Impression Statement Patient is doing very well. She amb well with RW, but with SPC has less DF on R and less confidence as  well. She also has decreased stance time on the R, but is able to correct with cueing. She reported pain in distal R ITB and lateral HS and responded well to manual therapy here. Normal response to modalities.   PT Treatment/Interventions ADLs/Self Care Home Management;Cryotherapy;Electrical Stimulation;Functional mobility training;Stair training;Gait training;Therapeutic activities;Therapeutic exercise;Neuromuscular re-education;Patient/family education;Passive range of motion;Manual techniques;Vasopneumatic Device   PT Next Visit Plan Nustep; PROM; Rockerboard; progress into total knee replacement protocol.  continue STW to ITB/lateral HS as indicated. No ultrasound.  Vasopneumatic and electrical stimulation.      Patient will benefit from skilled therapeutic intervention in order to improve the following deficits and impairments:  Decreased activity tolerance, Decreased strength, Increased edema, Pain, Decreased range of motion, Abnormal gait  Visit Diagnosis: Stiffness of right knee, not elsewhere classified  Localized edema     Problem List Patient Active Problem List   Diagnosis Date Noted  . Primary osteoarthritis of right knee 12/21/2016  . S/P knee replacement 12/21/2016    Madelyn Flavors PT 01/14/2017, 12:41 PM  [facility] [address] [place], [place], [address] Phone: [phone]   Fax:  [phone]  Name: Holly Tucker MRN: [id number] Date of Birth: 1930-03-12

## 2017-01-16 ENCOUNTER — Ambulatory Visit: Payer: Medicare Other | Admitting: Physical Therapy

## 2017-01-16 DIAGNOSIS — M25562 Pain in left knee: Secondary | ICD-10-CM

## 2017-01-16 DIAGNOSIS — R600 Localized edema: Secondary | ICD-10-CM

## 2017-01-16 DIAGNOSIS — M25661 Stiffness of right knee, not elsewhere classified: Secondary | ICD-10-CM

## 2017-01-16 NOTE — Therapy (Signed)
[facility] [place], [place], [address] Phone: [phone]   Fax:  [phone]  Physical Therapy Treatment  Patient Details  Name: Holly Tucker MRN: :[id number] Date of Birth: 1929-12-09 Referring Provider: Sydnee Cabal MD.  Encounter Date: 01/16/2017      PT End of Session - 01/16/17 1104    Visit Number 6   Number of Visits 12   Date for PT Re-Evaluation 03/04/17   PT Start Time 1031   PT Stop Time 1133   PT Time Calculation (min) 62 min   Activity Tolerance Patient tolerated treatment well   Behavior During Therapy [facility] for tasks assessed/performed      Past Medical History:  Diagnosis Date  . Arthritis   . Pneumonia     Past Surgical History:  Procedure Laterality Date  . ABDOMINAL HYSTERECTOMY    . APPENDECTOMY     taken when had hysterectomy  . EYE SURGERY     bilateral cataract surgery with lens implants  . TONSILLECTOMY     age 50  . TOTAL KNEE ARTHROPLASTY Right 12/21/2016   Procedure: RIGHT TOTAL KNEE ARTHROPLASTY;  Surgeon: Sydnee Cabal, MD;  Location: [facility];  Service: Orthopedics;  Laterality: Right;    There were no vitals filed for this visit.      Subjective Assessment - 01/16/17 1105    Subjective Still have pain behind back of knee.   Pain Score 2    Pain Location Knee   Pain Orientation Right   Pain Descriptors / Indicators Tightness   Pain Type Surgical pain   Pain Onset 1 to 4 weeks ago                         [facility] Adult PT Treatment/Exercise - 01/16/17 0001      Exercises   Exercises Knee/Hip     Knee/Hip Exercises: Aerobic   Nustep Level 5 x 15 minutes moving forward x 3 to increase knee flexion.     Knee/Hip Exercises: Supine   Short Arc Quad Sets Limitations SAQ's x 13 minutes facilitated with VMS x 13 minutes with 10 sec extension holds and 10 sec rest.     Acupuncturist Stimulation Location --  RT KNEE.   Electrical  Stimulation Action IFC   Electrical Stimulation Parameters 1-10 HZ x 15 minutes.   Electrical Stimulation Goals Edema;Pain     Vasopneumatic   Number Minutes Vasopneumatic  15 minutes   Vasopnuematic Location  --  RT KNEE.   Vasopneumatic Pressure Medium     Manual Therapy   Manual Therapy Soft tissue mobilization   Passive ROM Sustained right knee extension in supine x 10 minutes while performing STW/M                  PT Short Term Goals - 01/03/17 1848      PT SHORT TERM GOAL #1   Title Full active right knee extension.   Time 2   Period Weeks   Status New           PT Long Term Goals - 01/03/17 1849      PT LONG TERM GOAL #1   Title Independent with a HEP.   Time 8   Period Weeks   Status New     PT LONG TERM GOAL #2   Title Active right knee flexion to 115 degrees+ so the patient can perform functional tasks and do  so with pain not > 2-3/10.   Time 8   Period Weeks   Status New     PT LONG TERM GOAL #3   Title Increase right knee strength to a solid 4+/5 to provide good stability for accomplishment of functional activities   Time 8   Period Weeks   Status New     PT LONG TERM GOAL #4   Title Decrease edema to within 2.5 cms of non-affected side to assist with pain reduction and range of motion gains.   Time 8   Period Weeks   Status New     PT LONG TERM GOAL #5   Title Perform a reciprocating stair gait with one railing with pain not > 2-3/10.   Time 8   Period Weeks   Status New               Plan - 01/16/17 1110    PT Next Visit Plan Start treatsment with stationary bike.      Patient will benefit from skilled therapeutic intervention in order to improve the following deficits and impairments:  Decreased activity tolerance, Decreased strength, Increased edema, Pain, Decreased range of motion, Abnormal gait  Visit Diagnosis: Stiffness of right knee, not elsewhere classified  Localized edema  Acute pain of left  knee     Problem List Patient Active Problem List   Diagnosis Date Noted  . Primary osteoarthritis of right knee 12/21/2016  . S/P knee replacement 12/21/2016    Shamere Campas, [place] 01/16/2017, 12:18 PM  [facility] [address] [place], [place], [address] Phone: [phone]   Fax:  [phone]  Name: Holly Tucker MRN: [id number] Date of Birth: 14-Feb-1930

## 2017-01-18 ENCOUNTER — Ambulatory Visit: Payer: Medicare Other | Attending: Specialist | Admitting: Physical Therapy

## 2017-01-18 DIAGNOSIS — R600 Localized edema: Secondary | ICD-10-CM | POA: Diagnosis present

## 2017-01-18 DIAGNOSIS — M25661 Stiffness of right knee, not elsewhere classified: Secondary | ICD-10-CM | POA: Insufficient documentation

## 2017-01-18 DIAGNOSIS — M25562 Pain in left knee: Secondary | ICD-10-CM | POA: Diagnosis present

## 2017-01-18 NOTE — Therapy (Signed)
[facility] [place], [place], [address] Phone: [phone]   Fax:  [phone]  Physical Therapy Treatment  Patient Details  Name: Holly Tucker MRN: [id number] Date of Birth: [birth year] Referring Provider: Sydnee Cabal MD.  Encounter Date: 01/18/2017      PT End of Session - 01/18/17 1223    Activity Tolerance Patient tolerated treatment well   Behavior During Therapy [facility] for tasks assessed/performed      Past Medical History:  Diagnosis Date  . Arthritis   . Pneumonia     Past Surgical History:  Procedure Laterality Date  . ABDOMINAL HYSTERECTOMY    . APPENDECTOMY     taken when had hysterectomy  . EYE SURGERY     bilateral cataract surgery with lens implants  . TONSILLECTOMY     age 56  . TOTAL KNEE ARTHROPLASTY Right 12/21/2016   Procedure: RIGHT TOTAL KNEE ARTHROPLASTY;  Surgeon: Sydnee Cabal, MD;  Location: [facility];  Service: Orthopedics;  Laterality: Right;    There were no vitals filed for this visit.      Subjective Assessment - 01/18/17 1215    Subjective That last treatment helped a lot.   Pain Score 1    Pain Location Knee   Pain Orientation Right   Pain Descriptors / Indicators Tightness   Pain Type Surgical pain   Pain Onset 1 to 4 weeks ago                         [facility] Adult PT Treatment/Exercise - 01/18/17 0001      Exercises   Exercises Knee/Hip     Knee/Hip Exercises: Aerobic   Stationary Bike Seat 8 x 14 minutes.     Acupuncturist Location --  RT KNEE.   Electrical Stimulation Action IFC x 20 minutes.   Electrical Stimulation Goals Edema;Pain     Vasopneumatic   Number Minutes Vasopneumatic  20 minutes   Vasopnuematic Location  --  RT KNEE.   Vasopneumatic Pressure Medium     Manual Therapy   Manual Therapy Soft tissue mobilization   Passive ROM Sustained right knee extension stretching while performing STW/M x 9  minutes.                  PT Short Term Goals - 01/03/17 1848      PT SHORT TERM GOAL #1   Title Full active right knee extension.   Time 2   Period Weeks   Status New           PT Long Term Goals - 01/03/17 1849      PT LONG TERM GOAL #1   Title Independent with a HEP.   Time 8   Period Weeks   Status New     PT LONG TERM GOAL #2   Title Active right knee flexion to 115 degrees+ so the patient can perform functional tasks and do so with pain not > 2-3/10.   Time 8   Period Weeks   Status New     PT LONG TERM GOAL #3   Title Increase right knee strength to a solid 4+/5 to provide good stability for accomplishment of functional activities   Time 8   Period Weeks   Status New     PT LONG TERM GOAL #4   Title Decrease edema to within 2.5 cms of non-affected side to assist with pain reduction and range  of motion gains.   Time 8   Period Weeks   Status New     PT LONG TERM GOAL #5   Title Perform a reciprocating stair gait with one railing with pain not > 2-3/10.   Time 8   Period Weeks   Status New             Patient will benefit from skilled therapeutic intervention in order to improve the following deficits and impairments:  Decreased activity tolerance, Decreased strength, Increased edema, Pain, Decreased range of motion, Abnormal gait  Visit Diagnosis: Stiffness of right knee, not elsewhere classified  Localized edema  Acute pain of left knee     Problem List Patient Active Problem List   Diagnosis Date Noted  . Primary osteoarthritis of right knee 12/21/2016  . S/P knee replacement 12/21/2016    APPLEGATE, [place] MPT 01/18/2017, 12:41 PM  [facility] [place], [place], [address] Phone: [phone]   Fax:  [phone]  Name: Holly Tucker MRN: [id number] Date of Birth: Jan 01, 1930

## 2017-01-21 ENCOUNTER — Ambulatory Visit: Payer: Medicare Other | Admitting: Physical Therapy

## 2017-01-21 ENCOUNTER — Encounter: Payer: Self-pay | Admitting: Physical Therapy

## 2017-01-21 DIAGNOSIS — M25661 Stiffness of right knee, not elsewhere classified: Secondary | ICD-10-CM | POA: Diagnosis not present

## 2017-01-21 DIAGNOSIS — M25562 Pain in left knee: Secondary | ICD-10-CM

## 2017-01-21 DIAGNOSIS — R600 Localized edema: Secondary | ICD-10-CM

## 2017-01-21 NOTE — Therapy (Signed)
[facility] [place], [place], [address] Phone: [phone]   Fax:  [phone]  Physical Therapy Treatment  Patient Details  Name: Holly Tucker MRN:  [place]:[id number] Date of Birth: [birth year] Referring Provider: Sydnee Cabal MD.  Encounter Date: 01/21/2017      PT End of Session - 01/21/17 1037    Visit Number 8   Number of Visits 12   Date for PT Re-Evaluation 03/04/17   PT Start Time 1033   PT Stop Time 1124   PT Time Calculation (min) 51 min   Activity Tolerance Patient tolerated treatment well   Behavior During Therapy [facility] for tasks assessed/performed      Past Medical History:  Diagnosis Date  . Arthritis   . Pneumonia     Past Surgical History:  Procedure Laterality Date  . ABDOMINAL HYSTERECTOMY    . APPENDECTOMY     taken when had hysterectomy  . EYE SURGERY     bilateral cataract surgery with lens implants  . TONSILLECTOMY     age 83  . TOTAL KNEE ARTHROPLASTY Right 12/21/2016   Procedure: RIGHT TOTAL KNEE ARTHROPLASTY;  Surgeon: Sydnee Cabal, MD;  Location: [facility];  Service: Orthopedics;  Laterality: Right;    There were no vitals filed for this visit.      Subjective Assessment - 01/21/17 1035    Subjective Reports that she had a lot of pain last night as she went to church and the grocery store and other activites. Reports that she propped her foot up.   Pertinent History Left knee pain.   Limitations Walking   How long can you walk comfortably? Short distances with a FWW.   Patient [facility].   Currently in Pain? Yes   Pain Score 3    Pain Location Knee   Pain Orientation Right   Pain Descriptors / Indicators Discomfort   Pain Type Surgical pain   Pain Onset 1 to 4 weeks ago            [facility] PT Assessment - 01/21/17 0001      Assessment   Medical Diagnosis R TKR   Onset Date/Surgical Date 12/21/16   Next MD Visit 02/01/2017     Restrictions   Weight Bearing Restrictions  No                     [facility] Adult PT Treatment/Exercise - 01/21/17 0001      Knee/Hip Exercises: Aerobic   Stationary Bike Seat 8 x10 min     Knee/Hip Exercises: Standing   Forward Lunges Right;2 sets;10 reps;3 seconds   Lateral Step Up Right;1 set;15 reps;Hand Hold: 2;Step Height: 6"   Forward Step Up Right;2 sets;10 reps;Hand Hold: 2;Step Height: 6"   Rocker Board 3 minutes     Knee/Hip Exercises: Seated   Long Arc Quad Strengthening;Right;2 sets;10 reps;Weights   Long Arc Quad Weight 4 lbs.     Modalities   Modalities Teacher, early years/pre Action IFC   Electrical Stimulation Parameters 1-10 hz x15 min   Electrical Stimulation Goals Edema;Pain     Vasopneumatic   Number Minutes Vasopneumatic  15 minutes   Vasopnuematic Location  Knee   Vasopneumatic Pressure Medium   Vasopneumatic Temperature  34     Manual Therapy   Manual Therapy Soft tissue mobilization   Soft tissue mobilization STW/M to R calf to  reduce tone and pain                  PT Short Term Goals - 01/03/17 1848      PT SHORT TERM GOAL #1   Title Full active right knee extension.   Time 2   Period Weeks   Status New           PT Long Term Goals - 01/03/17 1849      PT LONG TERM GOAL #1   Title Independent with a HEP.   Time 8   Period Weeks   Status New     PT LONG TERM GOAL #2   Title Active right knee flexion to 115 degrees+ so the patient can perform functional tasks and do so with pain not > 2-3/10.   Time 8   Period Weeks   Status New     PT LONG TERM GOAL #3   Title Increase right knee strength to a solid 4+/5 to provide good stability for accomplishment of functional activities   Time 8   Period Weeks   Status New     PT LONG TERM GOAL #4   Title Decrease edema to within 2.5 cms of non-affected side to assist with pain reduction and range of  motion gains.   Time 8   Period Weeks   Status New     PT LONG TERM GOAL #5   Title Perform a reciprocating stair gait with one railing with pain not > 2-3/10.   Time 8   Period Weeks   Status New               Plan - 01/21/17 1109    Clinical Impression Statement Patient continues to do very well during treatment. Patient did experience increased stretch and discomfort with rockerboard today as he had experienced tightness in R calf. Patient ambulated into clinic with AD today with antalgic gait compensatory strategies noted. Patient had no other complaints during treatment today. STW and massage completed to R calf musculature to reduce the pain and tone experienced by patient. Normal modalities response noted following removal of the modalities.   Rehab Potential Excellent   PT Frequency 3x / week   PT Duration 4 weeks   PT Treatment/Interventions ADLs/Self Care Home Management;Cryotherapy;Electrical Stimulation;Functional mobility training;Stair training;Gait training;Therapeutic activities;Therapeutic exercise;Neuromuscular re-education;Patient/family education;Passive range of motion;Manual techniques;Vasopneumatic Device   PT Next Visit Plan Continue with R TKR protocol with STW and modalities PRN per MPT POC.   Consulted and Agree with Plan of Care Patient      Patient will benefit from skilled therapeutic intervention in order to improve the following deficits and impairments:  Decreased activity tolerance, Decreased strength, Increased edema, Pain, Decreased range of motion, Abnormal gait  Visit Diagnosis: Stiffness of right knee, not elsewhere classified  Localized edema  Acute pain of left knee     Problem List Patient Active Problem List   Diagnosis Date Noted  . Primary osteoarthritis of right knee 12/21/2016  . S/P knee replacement 12/21/2016    Elvina Sidle 01/21/2017, 11:30 AM  [facility] [place], [place], [address] Phone: [phone]   Fax:  [phone]  Name: Holly Tucker MRN:  [address]:[id number] Date of Birth: 01-23-1930

## 2017-01-23 ENCOUNTER — Ambulatory Visit: Payer: Medicare Other | Admitting: Physical Therapy

## 2017-01-23 NOTE — Therapy (Signed)
[facility] [place], [place], [address] Phone: [phone]   Fax:  [phone]  Patient Details  Name: Holly Tucker MRN: [id number] Date of Birth: [birth year] Referring Provider:  Deloria Lair., MD  Encounter Date: 01/23/2017  Patient arrived to treatment with complaint of increased RLE edema that she noticed beginning Monday afternoon. Upon observation patient's RLE appeared a darker coloration than LLE and an obvious amount of edema. RLE edema measured as 4 cm greater than LLE measured circumferentially around calf. Patient experienced increased soreness in anteriolateral aspect of R calf region according to patient report. Palpation of RLE calf also concluded of increased warmth of the R calf region. Obvious sock indentation also noted around R ankle to which patient reported that she had had on socks less than one hour. [place] Applegate, MPT was educated on the situation and he then examined the patient according to symptoms. MPT educated patient to be safe and call PCP in regards to getting a doppler completed to rule out DVT.   Ahmed Prima, PTA 01/23/17 10:53 AM  [facility] [address] [place], [place], [address] Phone: [phone]   Fax:  [phone]

## 2017-01-25 ENCOUNTER — Ambulatory Visit: Payer: Medicare Other | Admitting: Physical Therapy

## 2017-01-25 DIAGNOSIS — M25562 Pain in left knee: Secondary | ICD-10-CM

## 2017-01-25 DIAGNOSIS — R600 Localized edema: Secondary | ICD-10-CM

## 2017-01-25 DIAGNOSIS — M25661 Stiffness of right knee, not elsewhere classified: Secondary | ICD-10-CM | POA: Diagnosis not present

## 2017-01-25 NOTE — Therapy (Signed)
[facility] [place], [place], [address] Phone: [phone]   Fax:  [phone]  Physical Therapy Treatment  Patient Details  Name: Holly Tucker MRN: [id number] Date of Birth: 05-07-1930 Referring Provider: Sydnee Cabal MD.  Encounter Date: 01/25/2017      PT End of Session - 01/25/17 1127    Visit Number 9   Number of Visits 12   Date for PT Re-Evaluation 03/04/17   PT Start Time 1030   PT Stop Time 4010   PT Time Calculation (min) 56 min      Past Medical History:  Diagnosis Date  . Arthritis   . Pneumonia     Past Surgical History:  Procedure Laterality Date  . ABDOMINAL HYSTERECTOMY    . APPENDECTOMY     taken when had hysterectomy  . EYE SURGERY     bilateral cataract surgery with lens implants  . TONSILLECTOMY     age 83  . TOTAL KNEE ARTHROPLASTY Right 12/21/2016   Procedure: RIGHT TOTAL KNEE ARTHROPLASTY;  Surgeon: Sydnee Cabal, MD;  Location: [facility];  Service: Orthopedics;  Laterality: Right;    There were no vitals filed for this visit.      Subjective Assessment - 01/25/17 1129    Subjective I'm so happy that Doppler test was negative.   Pain Score 3    Pain Location Knee   Pain Orientation Right   Pain Descriptors / Indicators Discomfort   Pain Onset 1 to 4 weeks ago                         [facility] Adult PT Treatment/Exercise - 01/25/17 0001      Exercises   Exercises Knee/Hip     Knee/Hip Exercises: Aerobic   Nustep Level 5 x 15 minutes moving forward x 2 over 15 minutes.     Knee/Hip Exercises: Machines for Strengthening   Cybex Knee Extension 10# x 5 minutes.   Cybex Knee Flexion 30# x 5 minutes.     Modalities   Modalities Passenger transport manager Location RT knee.   Electrical Stimulation Action IFC x 15 minutes.   Electrical Stimulation Goals Edema;Pain     Vasopneumatic   Number Minutes  Vasopneumatic  15 minutes   Vasopnuematic Location  --  Right knee.   Vasopneumatic Pressure Medium     Manual Therapy   Manual therapy comments In supine performed PROM into right knee flexion and extension x 5 minutes.                  PT Short Term Goals - 01/25/17 1235      PT SHORT TERM GOAL #1   Title Full active right knee extension.   Time 2   Period Weeks   Status On-going           PT Long Term Goals - 01/25/17 1235      PT LONG TERM GOAL #1   Title Independent with a HEP.   Time 8   Period Weeks     PT LONG TERM GOAL #2   Title Active right knee flexion to 115 degrees+ so the patient can perform functional tasks and do so with pain not > 2-3/10.   Time 8   Period Weeks   Status On-going     PT LONG TERM GOAL #3   Title Increase right knee strength to a solid  4+/5 to provide good stability for accomplishment of functional activities   Time 8   Period Weeks   Status On-going     PT LONG TERM GOAL #4   Title Decrease edema to within 2.5 cms of non-affected side to assist with pain reduction and range of motion gains.   Time 8   Period Weeks   Status On-going     PT LONG TERM GOAL #5   Title Perform a reciprocating stair gait with one railing with pain not > 2-3/10.   Time 8   Period Weeks   Status On-going               Plan - 01/25/17 1131    Clinical Impression Statement Patient achieved right knee passive flexion to 110 degrees.  Circumfential calf measurement down 1.5 cms since Wednesday.        Patient will benefit from skilled therapeutic intervention in order to improve the following deficits and impairments:  Decreased activity tolerance, Decreased strength, Increased edema, Pain, Decreased range of motion, Abnormal gait  Visit Diagnosis: Stiffness of right knee, not elsewhere classified  Localized edema  Acute pain of left knee     Problem List Patient Active Problem List   Diagnosis Date Noted  . Primary  osteoarthritis of right knee 12/21/2016  . S/P knee replacement 12/21/2016    APPLEGATE, [place] 01/25/2017, 12:39 PM  [facility] [address] [place], [place], [address] Phone: [phone]   Fax:  [phone]  Name: Holly Tucker MRN: [id number] Date of Birth: [birth year]

## 2017-01-28 ENCOUNTER — Encounter: Payer: Self-pay | Admitting: Physical Therapy

## 2017-01-28 ENCOUNTER — Ambulatory Visit: Payer: Medicare Other | Admitting: Physical Therapy

## 2017-01-28 DIAGNOSIS — M25562 Pain in left knee: Secondary | ICD-10-CM

## 2017-01-28 DIAGNOSIS — M25661 Stiffness of right knee, not elsewhere classified: Secondary | ICD-10-CM | POA: Diagnosis not present

## 2017-01-28 DIAGNOSIS — R600 Localized edema: Secondary | ICD-10-CM

## 2017-01-28 NOTE — Therapy (Signed)
[facility] [place], [place], [address] Phone: [phone]   Fax:  [phone]  Physical Therapy Treatment  Patient Details  Name: Holly Tucker MRN: [id number] Date of Birth: 03/08/1930 Referring Provider: Sydnee Cabal MD.  Encounter Date: 01/28/2017      PT End of Session - 01/28/17 1003    Visit Number 10   Number of Visits 12   Date for PT Re-Evaluation 03/04/17   PT Start Time 1000   PT Stop Time 1058   PT Time Calculation (min) 58 min   Activity Tolerance Patient tolerated treatment well   Behavior During Therapy [facility] for tasks assessed/performed      Past Medical History:  Diagnosis Date  . Arthritis   . Pneumonia     Past Surgical History:  Procedure Laterality Date  . ABDOMINAL HYSTERECTOMY    . APPENDECTOMY     taken when had hysterectomy  . EYE SURGERY     bilateral cataract surgery with lens implants  . TONSILLECTOMY     age 33  . TOTAL KNEE ARTHROPLASTY Right 12/21/2016   Procedure: RIGHT TOTAL KNEE ARTHROPLASTY;  Surgeon: Sydnee Cabal, MD;  Location: [facility];  Service: Orthopedics;  Laterality: Right;    There were no vitals filed for this visit.      Subjective Assessment - 01/28/17 1001    Subjective Reports that she can walk around in the house better and reports that her RLE is looking better as well.   Pertinent History Left knee pain.   Limitations Walking   How long can you walk comfortably? Short distances with a FWW.   Patient [name].   Currently in Pain? Yes   Pain Score 2    Pain Location Knee   Pain Orientation Right   Pain Descriptors / Indicators Discomfort   Pain Type Surgical pain   Pain Onset 1 to 4 weeks ago   Pain Frequency Intermittent   Aggravating Factors  Knee flexion            [facility] PT Assessment - 01/28/17 0001      Assessment   Medical Diagnosis R TKR   Onset Date/Surgical Date 12/21/16   Next MD Visit 02/01/2017     Restrictions   Weight Bearing Restrictions No     ROM / Strength   AROM / PROM / Strength AROM     AROM   Overall AROM  Within functional limits for tasks performed   AROM Assessment Site Knee   Right/Left Knee Right   Right Knee Extension 0   Right Knee Flexion 118                     [facility] Adult PT Treatment/Exercise - 01/28/17 0001      Knee/Hip Exercises: Aerobic   Nustep L5 x10 min     Knee/Hip Exercises: Machines for Strengthening   Cybex Knee Extension 10# 3x10 reps   Cybex Knee Flexion 30# 3x10 reps     Knee/Hip Exercises: Standing   Terminal Knee Extension Limitations Green theraband RLE x15 reps with 3 sec hold   Hip Abduction AROM;Right;2 sets;10 reps;Knee bent   Forward Step Up Right;3 sets;10 reps;Hand Hold: 2;Step Height: 6"   Other Standing Knee Exercises L foot on 6" step to simulate RLE SLS x3 min     Modalities   Modalities Electrical Stimulation;Vasopneumatic     Electrical Stimulation   Electrical Stimulation Location R knee    Electrical  Stimulation Action IFC   Electrical Stimulation Parameters 1-10 hz x15 min   Electrical Stimulation Goals Edema;Pain     Vasopneumatic   Number Minutes Vasopneumatic  15 minutes   Vasopnuematic Location  Knee   Vasopneumatic Pressure Medium   Vasopneumatic Temperature  51     Manual Therapy   Manual Therapy Passive ROM   Passive ROM PROM of R knee into flexion/extension to promote ROM                  PT Short Term Goals - 01/28/17 1046      PT SHORT TERM GOAL #1   Title Full active right knee extension.   Time 2   Period Weeks   Status Achieved  0 deg R knee extension 01/28/2017           PT Long Term Goals - 01/28/17 1047      PT LONG TERM GOAL #1   Title Independent with a HEP.   Time 8   Period Weeks     PT LONG TERM GOAL #2   Title Active right knee flexion to 115 degrees+ so the patient can perform functional tasks and do so with pain not > 2-3/10.   Time 8   Period Weeks    Status Achieved  118 deg R knee flexion 01/28/2017     PT LONG TERM GOAL #3   Title Increase right knee strength to a solid 4+/5 to provide good stability for accomplishment of functional activities   Time 8   Period Weeks   Status On-going     PT LONG TERM GOAL #4   Title Decrease edema to within 2.5 cms of non-affected side to assist with pain reduction and range of motion gains.   Time 8   Period Weeks   Status On-going     PT LONG TERM GOAL #5   Title Perform a reciprocating stair gait with one railing with pain not > 2-3/10.   Time 8   Period Weeks   Status On-going               Plan - 01/28/17 1053    Clinical Impression Statement Patient continues to tolerate treatments well with only pain reported with R knee flexion. Patient initially attempted stationary bike but unable today. Patient guided throughout R knee and hip strengthening with moderate tactile and verbal cues as well as demonstration for RLE TKE. RLE SLS simulation conducted today with L foot on 6" step to which patient was able to maintain balance fairly well with intermittant UE support. AROM R knee measured as 0-118 deg in supine with discomfort with flexion. Normal modalities response noted following removal of the modalities.   Rehab Potential Excellent   PT Frequency 3x / week   PT Duration 4 weeks   PT Treatment/Interventions ADLs/Self Care Home Management;Cryotherapy;Electrical Stimulation;Functional mobility training;Stair training;Gait training;Therapeutic activities;Therapeutic exercise;Neuromuscular re-education;Patient/family education;Passive range of motion;Manual techniques;Vasopneumatic Device   PT Next Visit Plan Continue with R TKR protocol with STW and modalities PRN per MPT POC.   Consulted and Agree with Plan of Care Patient      Patient will benefit from skilled therapeutic intervention in order to improve the following deficits and impairments:  Decreased activity tolerance,  Decreased strength, Increased edema, Pain, Decreased range of motion, Abnormal gait  Visit Diagnosis: Stiffness of right knee, not elsewhere classified  Localized edema  Acute pain of left knee     Problem List Patient Active Problem List  Diagnosis Date Noted  . Primary osteoarthritis of right knee 12/21/2016  . S/P knee replacement 12/21/2016    Wynelle Fanny, PTA 01/28/2017, 11:07 AM  [facility] [place], [place], [address] Phone: [phone]   Fax:  [phone]  Name: Holly Tucker MRN: [id number] Date of Birth: 11-07-1930

## 2017-01-30 ENCOUNTER — Encounter: Payer: Self-pay | Admitting: Physical Therapy

## 2017-01-30 ENCOUNTER — Ambulatory Visit: Payer: Medicare Other | Admitting: Physical Therapy

## 2017-01-30 DIAGNOSIS — M25661 Stiffness of right knee, not elsewhere classified: Secondary | ICD-10-CM | POA: Diagnosis not present

## 2017-01-30 DIAGNOSIS — M25562 Pain in left knee: Secondary | ICD-10-CM

## 2017-01-30 DIAGNOSIS — R600 Localized edema: Secondary | ICD-10-CM

## 2017-01-30 NOTE — Therapy (Signed)
[facility] [place], [place], [address] Phone: [phone]   Fax:  [phone]  Physical Therapy Treatment  Patient Details  Name: Holly Tucker MRN: [id number] Date of Birth: 1930/02/18 Referring Provider: Sydnee Cabal MD.  Encounter Date: 01/30/2017      PT End of Session - 01/30/17 1100    Visit Number 11   Number of Visits 12   Date for PT Re-Evaluation 03/04/17   PT Start Time 1030   PT Stop Time 1120   PT Time Calculation (min) 50 min   Activity Tolerance Patient tolerated treatment well   Behavior During Therapy [facility] for tasks assessed/performed      Past Medical History:  Diagnosis Date  . Arthritis   . Pneumonia     Past Surgical History:  Procedure Laterality Date  . ABDOMINAL HYSTERECTOMY    . APPENDECTOMY     taken when had hysterectomy  . EYE SURGERY     bilateral cataract surgery with lens implants  . TONSILLECTOMY     age 83  . TOTAL KNEE ARTHROPLASTY Right 12/21/2016   Procedure: RIGHT TOTAL KNEE ARTHROPLASTY;  Surgeon: Sydnee Cabal, MD;  Location: [facility];  Service: Orthopedics;  Laterality: Right;    There were no vitals filed for this visit.      Subjective Assessment - 01/30/17 1038    Subjective Patient doing good thus far    Pertinent History Left knee pain.   Limitations Walking   How long can you walk comfortably? Short distances with a FWW.   Patient [place].   Currently in Pain? Yes   Pain Score 2    Pain Location Knee   Pain Orientation Right   Pain Descriptors / Indicators Discomfort   Pain Type Surgical pain   Pain Onset More than a month ago   Pain Frequency Intermittent   Aggravating Factors  bending knee   Pain Relieving Factors at rest                         [facility] Adult PT Treatment/Exercise - 01/30/17 0001      Knee/Hip Exercises: Aerobic   Nustep L5 x15 min     Knee/Hip Exercises: Machines for Strengthening   Cybex Knee  Extension 10# 3x10 reps   Cybex Knee Flexion 30# 3x10 reps     Knee/Hip Exercises: Standing   Terminal Knee Extension Limitations pink XTS 2x10   Lateral Step Up Right;2 sets;10 reps;Step Height: 6"   Forward Step Up Right;3 sets;10 reps;Hand Hold: 2;Step Height: 6"     Acupuncturist Location R knee    Electrical Stimulation Action IFC   Electrical Stimulation Parameters 1-10hz  x2mn   Electrical Stimulation Goals Edema;Pain     Vasopneumatic   Number Minutes Vasopneumatic  15 minutes   Vasopnuematic Location  Knee   Vasopneumatic Pressure Medium                  PT Short Term Goals - 01/28/17 1046      PT SHORT TERM GOAL #1   Title Full active right knee extension.   Time 2   Period Weeks   Status Achieved  0 deg R knee extension 01/28/2017           PT Long Term Goals - 01/28/17 1047      PT LONG TERM GOAL #1   Title Independent with a HEP.   Time 8  Period Weeks     PT LONG TERM GOAL #2   Title Active right knee flexion to 115 degrees+ so the patient can perform functional tasks and do so with pain not > 2-3/10.   Time 8   Period Weeks   Status Achieved  118 deg R knee flexion 01/28/2017     PT LONG TERM GOAL #3   Title Increase right knee strength to a solid 4+/5 to provide good stability for accomplishment of functional activities   Time 8   Period Weeks   Status On-going     PT LONG TERM GOAL #4   Title Decrease edema to within 2.5 cms of non-affected side to assist with pain reduction and range of motion gains.   Time 8   Period Weeks   Status On-going     PT LONG TERM GOAL #5   Title Perform a reciprocating stair gait with one railing with pain not > 2-3/10.   Time 8   Period Weeks   Status On-going               Plan - 01/30/17 1110    Clinical Impression Statement Patient progressing overall and tolerated treatment well today. Patient has little pain reported overall and only difficulty  reported is her neropathy. Patient only has increased pain with knee flexion. Patient has met her ROM goals and progressing toward remaining goals.    Rehab Potential Excellent   PT Frequency 3x / week   PT Duration 4 weeks   PT Treatment/Interventions ADLs/Self Care Home Management;Cryotherapy;Electrical Stimulation;Functional mobility training;Stair training;Gait training;Therapeutic activities;Therapeutic exercise;Neuromuscular re-education;Patient/family education;Passive range of motion;Manual techniques;Vasopneumatic Device   PT Next Visit Plan Continue with R TKR protoco and MD note tomorrow for her appt on friday   Consulted and Agree with Plan of Care Patient      Patient will benefit from skilled therapeutic intervention in order to improve the following deficits and impairments:  Decreased activity tolerance, Decreased strength, Increased edema, Pain, Decreased range of motion, Abnormal gait  Visit Diagnosis: Stiffness of right knee, not elsewhere classified  Localized edema  Acute pain of left knee     Problem List Patient Active Problem List   Diagnosis Date Noted  . Primary osteoarthritis of right knee 12/21/2016  . S/P knee replacement 12/21/2016    DUNFORD, CHRISTINA P, PTA 01/30/2017, 11:45 AM  [facility] [place], [place], [address] Phone: [phone]   Fax:  [phone]  Name: Holly Tucker MRN: [id number] Date of Birth: 08/27/1930

## 2017-01-31 ENCOUNTER — Ambulatory Visit: Payer: Medicare Other | Admitting: Physical Therapy

## 2017-01-31 ENCOUNTER — Encounter: Payer: Self-pay | Admitting: Physical Therapy

## 2017-01-31 DIAGNOSIS — R600 Localized edema: Secondary | ICD-10-CM

## 2017-01-31 DIAGNOSIS — M25661 Stiffness of right knee, not elsewhere classified: Secondary | ICD-10-CM | POA: Diagnosis not present

## 2017-01-31 DIAGNOSIS — M25562 Pain in left knee: Secondary | ICD-10-CM

## 2017-01-31 NOTE — Therapy (Signed)
Holly Tucker, [place], [address] Phone: [phone]   Fax:  [phone]  Physical Therapy Treatment  Patient Details  Name: Holly Tucker MRN: [id number] Date of Birth: 24-Aug-1930 Referring Provider: Sydnee Cabal MD.  Encounter Date: 01/31/2017      PT End of Session - 01/31/17 1027    Visit Number 12   Number of Visits 12   Date for PT Re-Evaluation 03/04/17   PT Start Time 1032   PT Stop Time 1122   PT Time Calculation (min) 50 min   Activity Tolerance Patient tolerated treatment well   Behavior During Therapy [facility] for tasks assessed/performed      Past Medical History:  Diagnosis Date  . Arthritis   . Pneumonia     Past Surgical History:  Procedure Laterality Date  . ABDOMINAL HYSTERECTOMY    . APPENDECTOMY     taken when had hysterectomy  . EYE SURGERY     bilateral cataract surgery with lens implants  . TONSILLECTOMY     age 78  . TOTAL KNEE ARTHROPLASTY Right 12/21/2016   Procedure: RIGHT TOTAL KNEE ARTHROPLASTY;  Surgeon: Sydnee Cabal, MD;  Location: [facility];  Service: Orthopedics;  Laterality: Right;    There were no vitals filed for this visit.      Subjective Assessment - 01/31/17 1027    Subjective Reports that she has a little discomfort in medial R knee today.   Pertinent History Left knee pain.   Limitations Walking   How long can you walk comfortably? Short distances with a FWW.   Patient [name].   Currently in Pain? Yes   Pain Score 1    Pain Location Knee   Pain Orientation Right;Medial   Pain Descriptors / Indicators Discomfort   Pain Type Surgical pain   Pain Onset More than a month ago            [facility] PT Assessment - 01/31/17 0001      Observation/Other Assessments-Edema    Edema Circumferential     Circumferential Edema   Circumferential - Right 44.4   Circumferential - Left  41.7     ROM / Strength   AROM / PROM / Strength AROM     AROM    Overall AROM  Within functional limits for tasks performed   AROM Assessment Site Knee   Right/Left Knee Right   Right Knee Extension -3   Right Knee Flexion 117                     [facility] Adult PT Treatment/Exercise - 01/31/17 0001      Modalities   Modalities Electrical Stimulation;Vasopneumatic     Electrical Stimulation   Electrical Stimulation Location R Knee   Electrical Stimulation Action IFC   Electrical Stimulation Parameters 1-10 hz x15 min   Electrical Stimulation Goals Edema;Pain     Vasopneumatic   Number Minutes Vasopneumatic  15 minutes   Vasopnuematic Location  Knee   Vasopneumatic Pressure Medium   Vasopneumatic Temperature  44     Manual Therapy   Manual Therapy Passive ROM;Soft tissue mobilization   Soft tissue mobilization R patella mobilizations to promote proper mobility   Passive ROM PROM of R knee into extension with holds at end range to promote proper ROM                  PT Short Term Goals - 01/28/17 1046  PT SHORT TERM GOAL #1   Title Full active right knee extension.   Time 2   Period Weeks   Status Achieved  0 deg R knee extension 01/28/2017           PT Long Term Goals - 01/28/17 1047      PT LONG TERM GOAL #1   Title Independent with a HEP.   Time 8   Period Weeks     PT LONG TERM GOAL #2   Title Active right knee flexion to 115 degrees+ so the patient can perform functional tasks and do so with pain not > 2-3/10.   Time 8   Period Weeks   Status Achieved  118 deg R knee flexion 01/28/2017     PT LONG TERM GOAL #3   Title Increase right knee strength to a solid 4+/5 to provide good stability for accomplishment of functional activities   Time 8   Period Weeks   Status On-going     PT LONG TERM GOAL #4   Title Decrease edema to within 2.5 cms of non-affected side to assist with pain reduction and range of motion gains.   Time 8   Period Weeks   Status On-going     PT LONG TERM GOAL #5    Title Perform a reciprocating stair gait with one railing with pain not > 2-3/10.   Time 8   Period Weeks   Status On-going               Plan - 01/31/17 1108    Clinical Impression Statement Patient presented in clinic with low R knee pain although pain indicated as more medial today. Increased RLE edema still present upon observation of RLE with sock indentation noted along with minimal increased coloration and heat although blood clot ruled out previously. Patient had no complaints of pain with any exercises today as focus is still on strengthening RLE. AROM of R knee measured as 3-117 deg although full extension has been achieved previously. 2.7 cm difference measured regarding circumferential knee edema with R > L. Normal modalities response noted following removal of the modalities. Patient denies as difficulty with activities at home and has not been using AD in her home only outside of home.   Rehab Potential Excellent   PT Frequency 3x / week   PT Duration 4 weeks   PT Treatment/Interventions ADLs/Self Care Home Management;Cryotherapy;Electrical Stimulation;Functional mobility training;Stair training;Gait training;Therapeutic activities;Therapeutic exercise;Neuromuscular re-education;Patient/family education;Passive range of motion;Manual techniques;Vasopneumatic Device   PT Next Visit Plan Continue with R TKR protoco and MD note tomorrow for her appt on friday   Consulted and Agree with Plan of Care Patient      Patient will benefit from skilled therapeutic intervention in order to improve the following deficits and impairments:  Decreased activity tolerance, Decreased strength, Increased edema, Pain, Decreased range of motion, Abnormal gait  Visit Diagnosis: Stiffness of right knee, not elsewhere classified  Localized edema  Acute pain of left knee     Problem List Patient Active Problem List   Diagnosis Date Noted  . Primary osteoarthritis of right knee 12/21/2016   . S/P knee replacement 12/21/2016    Ahmed Prima, PTA 01/31/17 5:27 PM Holly Tucker MPT [facility] [place], [place], [address] Phone: [phone]   Fax:  [phone]  Name: [name] MRN: [id number] Date of Birth: Apr 11, 1930

## 2017-02-04 ENCOUNTER — Ambulatory Visit: Payer: Medicare Other | Admitting: Physical Therapy

## 2017-02-04 ENCOUNTER — Encounter: Payer: Self-pay | Admitting: Physical Therapy

## 2017-02-04 DIAGNOSIS — R600 Localized edema: Secondary | ICD-10-CM

## 2017-02-04 DIAGNOSIS — M25562 Pain in left knee: Secondary | ICD-10-CM

## 2017-02-04 DIAGNOSIS — M25661 Stiffness of right knee, not elsewhere classified: Secondary | ICD-10-CM

## 2017-02-04 NOTE — Therapy (Signed)
[facility] [place], [place], [address] Phone: [phone]   Fax:  [phone]  Physical Therapy Treatment  Patient Details  Name: Holly Tucker MRN: [id number] Date of Birth: 1930/03/10 Referring Provider: Sydnee Cabal MD.  Encounter Date: 02/04/2017      PT End of Session - 02/04/17 1102    Visit Number 13   Date for PT Re-Evaluation 03/04/17   PT Start Time 1028   PT Stop Time 1126   PT Time Calculation (min) 58 min   Activity Tolerance Patient tolerated treatment well   Behavior During Therapy [facility] for tasks assessed/performed      Past Medical History:  Diagnosis Date  . Arthritis   . Pneumonia     Past Surgical History:  Procedure Laterality Date  . ABDOMINAL HYSTERECTOMY    . APPENDECTOMY     taken when had hysterectomy  . EYE SURGERY     bilateral cataract surgery with lens implants  . TONSILLECTOMY     age 83  . TOTAL KNEE ARTHROPLASTY Right 12/21/2016   Procedure: RIGHT TOTAL KNEE ARTHROPLASTY;  Surgeon: Sydnee Cabal, MD;  Location: [facility];  Service: Orthopedics;  Laterality: Right;    There were no vitals filed for this visit.      Subjective Assessment - 02/04/17 1036    Subjective Patient went to MD and is to continue a few more weeks of therapy.   Pertinent History Left knee pain.   Limitations Walking   How long can you walk comfortably? Short distances with a FWW.   Patient Holly [place].   Currently in Pain? Yes   Pain Score 1    Pain Location Knee   Pain Orientation Right;Medial   Pain Descriptors / Indicators Discomfort   Pain Type Surgical pain   Pain Onset More than a month ago   Pain Frequency Intermittent   Aggravating Factors  bending knee   Pain Relieving Factors at rest                         [facility] Adult PT Treatment/Exercise - 02/04/17 0001      Knee/Hip Exercises: Aerobic   Nustep L5 x15 min     Knee/Hip Exercises: Machines for Strengthening    Cybex Knee Extension 10# 3x10 reps   Cybex Knee Flexion 30# 3x10 reps     Knee/Hip Exercises: Standing   Lateral Step Up Right;2 sets;10 reps;Step Height: 6"   Forward Step Up Right;3 sets;10 reps;Hand Hold: 2;Step Height: 6"     Knee/Hip Exercises: Supine   Bridges with Clamshell Strengthening;Both;2 sets;10 reps  with green t-band   Straight Leg Raise with External Rotation Right;3 sets;10 reps     Knee/Hip Exercises: Sidelying   Hip ABduction Strengthening;Right;2 sets;10 reps     Acupuncturist Location R Knee   Electrical Stimulation Action IFC   Electrical Stimulation Parameters 1-10hz  x19min   Electrical Stimulation Goals Edema;Pain     Vasopneumatic   Number Minutes Vasopneumatic  15 minutes   Vasopnuematic Location  Knee   Vasopneumatic Pressure Medium                  PT Short Term Goals - 01/28/17 1046      PT SHORT TERM GOAL #1   Title Full active right knee extension.   Time 2   Period Weeks   Status Achieved  0 deg R knee extension 01/28/2017  PT Long Term Goals - 01/28/17 1047      PT LONG TERM GOAL #1   Title Independent with a HEP.   Time 8   Period Weeks     PT LONG TERM GOAL #2   Title Active right knee flexion to 115 degrees+ so the patient can perform functional tasks and do so with pain not > 2-3/10.   Time 8   Period Weeks   Status Achieved  118 deg R knee flexion 01/28/2017     PT LONG TERM GOAL #3   Title Increase right knee strength to a solid 4+/5 to provide good stability for accomplishment of functional activities   Time 8   Period Weeks   Status On-going     PT LONG TERM GOAL #4   Title Decrease edema to within 2.5 cms of non-affected side to assist with pain reduction and range of motion gains.   Time 8   Period Weeks   Status On-going     PT LONG TERM GOAL #5   Title Perform a reciprocating stair gait with one railing with pain not > 2-3/10.   Time 8   Period  Weeks   Status On-going               Plan - 02/04/17 1103    Clinical Impression Statement Patient tolerated treatment well today. Patient progressing with strengthening exercises today with good technique. Patient went to MD and is to cont therapy. Patient also went for doppler test and there was no blood clot. Patient doing well overall and progressing toward strength goal.    Rehab Potential Excellent   PT Frequency 3x / week   PT Duration 4 weeks   PT Treatment/Interventions ADLs/Self Care Home Management;Cryotherapy;Electrical Stimulation;Functional mobility training;Stair training;Gait training;Therapeutic activities;Therapeutic exercise;Neuromuscular re-education;Patient/family education;Passive range of motion;Manual techniques;Vasopneumatic Device   PT Next Visit Plan Continue with R TKR protocol    Consulted and Agree with Plan of Care Patient      Patient will benefit from skilled therapeutic intervention in order to improve the following deficits and impairments:  Decreased activity tolerance, Decreased strength, Increased edema, Pain, Decreased range of motion, Abnormal gait  Visit Diagnosis: Stiffness of right knee, not elsewhere classified  Localized edema  Acute pain of left knee     Problem List Patient Active Problem List   Diagnosis Date Noted  . Primary osteoarthritis of right knee 12/21/2016  . S/P knee replacement 12/21/2016    Holly Tucker, PTA 02/04/17 11:27 AM  [facility] [place], [place], [address] Phone: [phone]   Fax:  [phone]  Name: Holly Tucker MRN: [id number] Date of Birth: [birth year]

## 2017-02-06 ENCOUNTER — Encounter: Payer: Medicare Other | Admitting: Physical Therapy

## 2017-02-08 ENCOUNTER — Ambulatory Visit: Payer: Medicare Other | Admitting: Physical Therapy

## 2017-02-08 ENCOUNTER — Encounter: Payer: Self-pay | Admitting: Physical Therapy

## 2017-02-08 DIAGNOSIS — R600 Localized edema: Secondary | ICD-10-CM

## 2017-02-08 DIAGNOSIS — M25562 Pain in left knee: Secondary | ICD-10-CM

## 2017-02-08 DIAGNOSIS — M25661 Stiffness of right knee, not elsewhere classified: Secondary | ICD-10-CM | POA: Diagnosis not present

## 2017-02-08 NOTE — Therapy (Signed)
[facility] [place], [place], [address] Phone: [phone]   Fax:  [phone]  Physical Therapy Treatment  Patient Details  Name: Holly Tucker MRN: [id number] Date of Birth: July 02, 1930 Referring Provider: Sydnee Cabal MD.  Encounter Date: 02/08/2017      PT End of Session - 02/08/17 1324    Visit Number 14   Number of Visits 24   Date for PT Re-Evaluation 05/03/17   PT Start Time 1030   PT Stop Time 1115   PT Time Calculation (min) 45 min   Activity Tolerance Patient tolerated treatment well   Behavior During Therapy [facility] for tasks assessed/performed      Past Medical History:  Diagnosis Date  . Arthritis   . Pneumonia     Past Surgical History:  Procedure Laterality Date  . ABDOMINAL HYSTERECTOMY    . APPENDECTOMY     taken when had hysterectomy  . EYE SURGERY     bilateral cataract surgery with lens implants  . TONSILLECTOMY     age 24  . TOTAL KNEE ARTHROPLASTY Right 12/21/2016   Procedure: RIGHT TOTAL KNEE ARTHROPLASTY;  Surgeon: Sydnee Cabal, MD;  Location: [facility];  Service: Orthopedics;  Laterality: Right;    There were no vitals filed for this visit.      Subjective Assessment - 02/08/17 1316    Subjective Patient arriving to therapy reporting 3/10 pain in her R knee with walking and bending today.    Pertinent History Left knee pain.   Limitations Walking   How long can you walk comfortably? Short household distances   Patient [place]. Walk without pain   Currently in Pain? Yes   Pain Score 3    Pain Location Knee   Pain Orientation Right;Medial   Pain Descriptors / Indicators Aching   Pain Type Surgical pain   Pain Onset More than a month ago   Aggravating Factors  bending the knee   Pain Relieving Factors resting                         [facility] Adult PT Treatment/Exercise - 02/08/17 0001      Exercises   Exercises Knee/Hip     Knee/Hip Exercises: Aerobic    Nustep l5 x 15 minutes     Knee/Hip Exercises: Machines for Strengthening   Cybex Knee Extension 10# 3x10 reps   Cybex Knee Flexion 40# 3x 10 reps     Knee/Hip Exercises: Standing   Heel Raises 10 reps   Heel Raises Limitations UE support   Hip Flexion 20 reps   Hip Flexion Limitations UE support   Hip ADduction Both;15 reps;Limitations   Hip ADduction Limitations with UE support   Other Standing Knee Exercises Hamstring curls x 10 each LE with UE support     Modalities   Modalities Electrical Stimulation     Electrical Stimulation   Electrical Stimulation Location R Knee   Electrical Stimulation Action IFC   Electrical Stimulation Parameters 1-10Hz  x 15 minutes   Electrical Stimulation Goals Edema;Pain     Vasopneumatic   Number Minutes Vasopneumatic  15 minutes   Vasopnuematic Location  Knee   Vasopneumatic Pressure Medium                PT Education - 02/08/17 1318    Education provided Yes   Education Details Pt encouraged to work on flexion and extension and her HEP   Person(s) Educated Patient  Methods Explanation   Comprehension Verbalized understanding          PT Short Term Goals - 01/28/17 1046      PT SHORT TERM GOAL #1   Title Full active right knee extension.   Time 2   Period Weeks   Status Achieved  0 deg R knee extension 01/28/2017           PT Long Term Goals - 02/08/17 1329      PT LONG TERM GOAL #1   Title Independent with a HEP.   Time 8   Period Weeks   Status On-going     PT LONG TERM GOAL #2   Title Active right knee flexion to 115 degrees+ so the patient can perform functional tasks and do so with pain not > 2-3/10.   Time 8   Period Weeks   Status Achieved     PT LONG TERM GOAL #3   Title Increase right knee strength to a solid 4+/5 to provide good stability for accomplishment of functional activities   Time 8   Period Weeks   Status On-going     PT LONG TERM GOAL #4   Title Decrease edema to within 2.5  cms of non-affected side to assist with pain reduction and range of motion gains.   Time 8   Period Weeks   Status On-going     PT LONG TERM GOAL #5   Title Perform a reciprocating stair gait with one railing with pain not > 2-3/10.   Time 8   Period Weeks   Status On-going             Patient will benefit from skilled therapeutic intervention in order to improve the following deficits and impairments:     Visit Diagnosis: Stiffness of right knee, not elsewhere classified  Localized edema  Acute pain of left knee     Problem List Patient Active Problem List   Diagnosis Date Noted  . Primary osteoarthritis of right knee 12/21/2016  . S/P knee replacement 12/21/2016    Oretha Caprice, MPT 02/08/2017, 1:31 PM  [facility] [place], [place], [address] Phone: [phone]   Fax:  [phone]  Name: Katisha Shimizu MRN: [id number] Date of Birth: 1930/07/06

## 2017-02-13 ENCOUNTER — Ambulatory Visit: Payer: Medicare Other | Admitting: Physical Therapy

## 2017-02-13 DIAGNOSIS — R600 Localized edema: Secondary | ICD-10-CM

## 2017-02-13 DIAGNOSIS — M25661 Stiffness of right knee, not elsewhere classified: Secondary | ICD-10-CM | POA: Diagnosis not present

## 2017-02-13 DIAGNOSIS — M25562 Pain in left knee: Secondary | ICD-10-CM

## 2017-02-13 NOTE — Therapy (Signed)
[facility] [place], [place], [address] Phone: [phone]   Fax:  [phone]  Physical Therapy Treatment  Patient Details  Name: Holly Tucker MRN: [id number] Date of Birth: 06-May-1978 Referring Provider: Sydnee Cabal MD.  Encounter Date: 02/13/2017      PT End of Session - 02/13/17 1323    Visit Number 15   Number of Visits 24   Date for PT Re-Evaluation 05/03/17   PT Start Time 1200   PT Stop Time 1251   PT Time Calculation (min) 51 min   Activity Tolerance Patient tolerated treatment well   Behavior During Therapy [facility] for tasks assessed/performed      Past Medical History:  Diagnosis Date  . Arthritis   . Pneumonia     Past Surgical History:  Procedure Laterality Date  . ABDOMINAL HYSTERECTOMY    . APPENDECTOMY     taken when had hysterectomy  . EYE SURGERY     bilateral cataract surgery with lens implants  . TONSILLECTOMY     age 8  . TOTAL KNEE ARTHROPLASTY Right 12/21/2016   Procedure: RIGHT TOTAL KNEE ARTHROPLASTY;  Surgeon: Sydnee Cabal, MD;  Location: [facility];  Service: Orthopedics;  Laterality: Right;    There were no vitals filed for this visit.      Subjective Assessment - 02/13/17 1313    Subjective I'm pleased with my progress.   Pain Score 3    Pain Orientation Right;Medial   Pain Descriptors / Indicators Aching   Pain Type Surgical pain                         [facility] Adult PT Treatment/Exercise - 02/13/17 0001      Exercises   Exercises Knee/Hip     Knee/Hip Exercises: Aerobic   Stationary Bike 11 minutes at seat 7.     Knee/Hip Exercises: Machines for Strengthening   Cybex Knee Extension 10# x 5 minutes.   Cybex Knee Flexion 40# x 5 minutes.   Cybex Leg Press 2 plates x 3 minutes.     Modalities   Modalities Passenger transport manager Location Right kne.   Electrical Stimulation Action IFC   Electrical Stimulation Parameters 1-10 Hz x 15 minutes.   Electrical Stimulation Goals Edema;Pain     Vasopneumatic   Number Minutes Vasopneumatic  15 minutes   Vasopnuematic Location  --  Right knee.   Vasopneumatic Pressure Medium                  PT Short Term Goals - 01/28/17 1046      PT SHORT TERM GOAL #1   Title Full active right knee extension.   Time 2   Period Weeks   Status Achieved  0 deg R knee extension 01/28/2017           PT Long Term Goals - 02/08/17 1329      PT LONG TERM GOAL #1   Title Independent with a HEP.   Time 8   Period Weeks   Status On-going     PT LONG TERM GOAL #2   Title Active right knee flexion to 115 degrees+ so the patient can perform functional tasks and do so with pain not > 2-3/10.   Time 8   Period Weeks   Status Achieved     PT LONG TERM GOAL #3   Title Increase right knee strength  to a solid 4+/5 to provide good stability for accomplishment of functional activities   Time 8   Period Weeks   Status On-going     PT LONG TERM GOAL #4   Title Decrease edema to within 2.5 cms of non-affected side to assist with pain reduction and range of motion gains.   Time 8   Period Weeks   Status On-going     PT LONG TERM GOAL #5   Title Perform a reciprocating stair gait with one railing with pain not > 2-3/10.   Time 8   Period Weeks   Status On-going               Plan - 02/13/17 1324    Clinical Impression Statement Excellent progress toward goals.      Patient will benefit from skilled therapeutic intervention in order to improve the following deficits and impairments:  Decreased activity tolerance, Decreased strength, Increased edema, Pain, Decreased range of motion, Abnormal gait  Visit Diagnosis: Stiffness of right knee, not elsewhere classified  Localized edema  Acute pain of left knee     Problem List Patient Active Problem List   Diagnosis Date Noted  . Primary osteoarthritis of right  knee 12/21/2016  . S/P knee replacement 12/21/2016    Yulonda Wheeling, [place] MPT 02/13/2017, 1:25 PM  [facility] [address] [place], [place], [address] Phone: [phone]   Fax:  [phone]  Name: Codee Bloodworth MRN: [id number] Date of Birth: 10-20-1978

## 2017-02-18 ENCOUNTER — Ambulatory Visit: Payer: Medicare Other | Attending: Specialist | Admitting: Physical Therapy

## 2017-02-18 DIAGNOSIS — M25661 Stiffness of right knee, not elsewhere classified: Secondary | ICD-10-CM | POA: Diagnosis present

## 2017-02-18 DIAGNOSIS — M25562 Pain in left knee: Secondary | ICD-10-CM | POA: Insufficient documentation

## 2017-02-18 DIAGNOSIS — R600 Localized edema: Secondary | ICD-10-CM | POA: Insufficient documentation

## 2017-02-18 NOTE — Patient Instructions (Signed)
Hip Flexor Stretch   Lying on back near edge of bed, bend one leg, foot flat. Hang other leg over edge, relaxed, thigh off bed for _1___ minutes. Repeat __3 times. Do _2-3___ sessions per day.  Flatten low back toward bed.  Madelyn Flavors, PT 02/18/17 2:46 PM [facility] [place], [place], [address] Phone: [phone]   Fax:  [phone]

## 2017-02-18 NOTE — Therapy (Signed)
[facility] [place], [place], [address] Phone: [phone]   Fax:  [phone]  Physical Therapy Treatment  Patient Details  Name: Holly Tucker MRN: [id number] Date of Birth: May 27, 1930 Referring Provider: Sydnee Cabal MD.  Encounter Date: 02/18/2017      PT End of Session - 02/18/17 1348    Visit Number 16   Number of Visits 24   Date for PT Re-Evaluation 05/03/17   PT Start Time 8938   PT Stop Time 1442   PT Time Calculation (min) 54 min   Activity Tolerance Patient tolerated treatment well   Behavior During Therapy [facility] for tasks assessed/performed      Past Medical History:  Diagnosis Date  . Arthritis   . Pneumonia     Past Surgical History:  Procedure Laterality Date  . ABDOMINAL HYSTERECTOMY    . APPENDECTOMY     taken when had hysterectomy  . EYE SURGERY     bilateral cataract surgery with lens implants  . TONSILLECTOMY     age 3  . TOTAL KNEE ARTHROPLASTY Right 12/21/2016   Procedure: RIGHT TOTAL KNEE ARTHROPLASTY;  Surgeon: Sydnee Cabal, MD;  Location: [facility];  Service: Orthopedics;  Laterality: Right;    There were no vitals filed for this visit.      Subjective Assessment - 02/18/17 1349    Subjective Patient states she is hurting a little from wearing shoes she shouldn't have yesterday.   Patient [place]. Walk without pain   Currently in Pain? Yes   Pain Score 1    Pain Location Knee   Pain Orientation Right;Medial   Pain Descriptors / Indicators Aching   Pain Type Surgical pain            [facility] PT Assessment - 02/18/17 0001      Circumferential Edema   Circumferential - Right 44.5 cm     Strength   Overall Strength Comments R knee flex 4-/5, ext 5/5                     [facility] Adult PT Treatment/Exercise - 02/18/17 0001      Ambulation/Gait   Ambulation/Gait Yes   Ambulation/Gait Assistance 7: Independent   Ambulation Distance (Feet) 60 Feet   Assistive device None   Gait Pattern Within Functional Limits   Ambulation Surface Level   Gait Comments short stride length due to neuropathy     Knee/Hip Exercises: Stretches   Hip Flexor Stretch Right;1 rep  x 2 min      Knee/Hip Exercises: Aerobic   Nustep L 5 x 15 min     Knee/Hip Exercises: Machines for Strengthening   Cybex Knee Extension 10# 3x10 reps  R only   Cybex Knee Flexion 40# 3x 10 reps     Knee/Hip Exercises: Prone   Hamstring Curl 10 reps;2 sets   Hamstring Curl Limitations 7.5 #     Electrical Stimulation   Electrical Stimulation Location r knee   Electrical Stimulation Action premod   Electrical Stimulation Parameters 1-10 Hz x 15 min   Electrical Stimulation Goals Edema     Vasopneumatic   Number Minutes Vasopneumatic  15 minutes   Vasopnuematic Location  Knee   Vasopneumatic Pressure Medium   Vasopneumatic Temperature  44                PT Education - 02/18/17 1545    Education provided Yes   Education Details HEP  Person(s) Educated Patient   Methods Explanation;Demonstration;Handout   Comprehension Verbalized understanding;Returned demonstration          PT Short Term Goals - 01/28/17 1046      PT SHORT TERM GOAL #1   Title Full active right knee extension.   Time 2   Period Weeks   Status Achieved  0 deg R knee extension 01/28/2017           PT Long Term Goals - 02/18/17 1548      PT LONG TERM GOAL #1   Title Independent with a HEP.   Period Weeks   Status Achieved     PT LONG TERM GOAL #2   Title Active right knee flexion to 115 degrees+ so the patient can perform functional tasks and do so with pain not > 2-3/10.   Period Weeks   Status Achieved     PT LONG TERM GOAL #3   Title Increase right knee strength to a solid 4+/5 to provide good stability for accomplishment of functional activities   Baseline R knee flex 4-/5  on 02/18/17   Period Weeks   Status On-going     PT LONG TERM GOAL #4   Title Decrease  edema to within 2.5 cms of non-affected side to assist with pain reduction and range of motion gains.   Baseline 44.5 cm on 02/18/17   Time 8   Period Weeks   Status On-going               Plan - 02/18/17 1552    Clinical Impression Statement Patient is progressing with goals but still demos weakness with R knee flexion. Her gait has normalized to PLOF.   Rehab Potential Excellent   PT Frequency 3x / week   PT Duration 4 weeks   PT Treatment/Interventions ADLs/Self Care Home Management;Cryotherapy;Electrical Stimulation;Functional mobility training;Stair training;Gait training;Therapeutic activities;Therapeutic exercise;Neuromuscular re-education;Patient/family education;Passive range of motion;Manual techniques;Vasopneumatic Device   PT Next Visit Plan continue with prone knee flexion. Modalities for edema.   PT Home Exercise Plan R hip flexor stretch off EOB   Consulted and Agree with Plan of Care Patient      Patient will benefit from skilled therapeutic intervention in order to improve the following deficits and impairments:  Decreased activity tolerance, Decreased strength, Increased edema, Pain, Decreased range of motion, Abnormal gait  Visit Diagnosis: Stiffness of right knee, not elsewhere classified  Localized edema  Acute pain of left knee     Problem List Patient Active Problem List   Diagnosis Date Noted  . Primary osteoarthritis of right knee 12/21/2016  . S/P knee replacement 12/21/2016    Madelyn Flavors PT 02/18/2017, 3:53 PM  [facility] [address] [place], [place], [address] Phone: [phone]   Fax:  [phone]  Name: Holly Tucker MRN: [id number] Date of Birth: Dec 28, 1929

## 2017-02-20 ENCOUNTER — Ambulatory Visit: Payer: Medicare Other | Admitting: Physical Therapy

## 2017-02-20 ENCOUNTER — Encounter: Payer: Self-pay | Admitting: Physical Therapy

## 2017-02-20 DIAGNOSIS — M25562 Pain in left knee: Secondary | ICD-10-CM

## 2017-02-20 DIAGNOSIS — R600 Localized edema: Secondary | ICD-10-CM

## 2017-02-20 DIAGNOSIS — M25661 Stiffness of right knee, not elsewhere classified: Secondary | ICD-10-CM | POA: Diagnosis not present

## 2017-02-20 NOTE — Therapy (Signed)
[facility] [place], [place], [address] Phone: [phone]   Fax:  [phone]  Physical Therapy Treatment  Patient Details  Name: Holly Tucker MRN: [id number] Date of Birth: 12/17/1929 Referring Provider: Sydnee Cabal MD.  Encounter Date: 02/20/2017      PT End of Session - 02/20/17 1110    Visit Number 17   Number of Visits 24   Date for PT Re-Evaluation 05/03/17   PT Start Time 1034   PT Stop Time 1129   PT Time Calculation (min) 55 min   Activity Tolerance Patient tolerated treatment well   Behavior During Therapy [employer] for tasks assessed/performed      Past Medical History:  Diagnosis Date  . Arthritis   . Pneumonia     Past Surgical History:  Procedure Laterality Date  . ABDOMINAL HYSTERECTOMY    . APPENDECTOMY     taken when had hysterectomy  . EYE SURGERY     bilateral cataract surgery with lens implants  . TONSILLECTOMY     age 83  . TOTAL KNEE ARTHROPLASTY Right 12/21/2016   Procedure: RIGHT TOTAL KNEE ARTHROPLASTY;  Surgeon: Sydnee Cabal, MD;  Location: [facility];  Service: Orthopedics;  Laterality: Right;    There were no vitals filed for this visit.      Subjective Assessment - 02/20/17 1037    Subjective Patient repored still some ongoing soreness yet overall improvement   Pertinent History Left knee pain.   Limitations Walking   How long can you walk comfortably? Short household distances   Patient [place]. Walk without pain   Currently in Pain? Yes   Pain Score 1    Pain Location Knee   Pain Orientation Right;Medial   Pain Descriptors / Indicators Aching   Pain Type Surgical pain   Pain Onset More than a month ago   Pain Frequency Intermittent   Aggravating Factors  bending the knee   Pain Relieving Factors at rest                         [facility] Adult PT Treatment/Exercise - 02/20/17 0001      Knee/Hip Exercises: Aerobic   Nustep L 5 x 15 min     Knee/Hip Exercises: Machines for Strengthening   Cybex Knee Extension 10# 3x10 reps  eccentric lowering   Cybex Knee Flexion 40# 3x 10 reps     Knee/Hip Exercises: Standing   Forward Step Up Right;10 reps;Step Height: 8";3 sets   Forward Step Up Limitations 14" step up x4 to similate getting on mowewr per patient request/SBA/CGA   Other Standing Knee Exercises Hamstring curls 2x 10 each LE with UE support     Knee/Hip Exercises: Supine   Bridges with Clamshell Strengthening;Both;2 sets;10 reps  green t-band     Acupuncturist Location r knee   Electrical Stimulation Action premod   Electrical Stimulation Parameters 1-10hz  x30min   Electrical Stimulation Goals Edema     Vasopneumatic   Number Minutes Vasopneumatic  15 minutes   Vasopnuematic Location  Knee   Vasopneumatic Pressure Medium                  PT Short Term Goals - 01/28/17 1046      PT SHORT TERM GOAL #1   Title Full active right knee extension.   Time 2   Period Weeks   Status Achieved  0 deg R knee extension  01/28/2017           PT Long Term Goals - 02/18/17 1548      PT LONG TERM GOAL #1   Title Independent with a HEP.   Period Weeks   Status Achieved     PT LONG TERM GOAL #2   Title Active right knee flexion to 115 degrees+ so the patient can perform functional tasks and do so with pain not > 2-3/10.   Period Weeks   Status Achieved     PT LONG TERM GOAL #3   Title Increase right knee strength to a solid 4+/5 to provide good stability for accomplishment of functional activities   Baseline R knee flex 4-/5  on 02/18/17   Period Weeks   Status On-going     PT LONG TERM GOAL #4   Title Decrease edema to within 2.5 cms of non-affected side to assist with pain reduction and range of motion gains.   Baseline 44.5 cm on 02/18/17   Time 8   Period Weeks   Status On-going               Plan - 02/20/17 1111    Clinical Impression Statement Patient  tolerated treatment well overall. Patient feels like she is doing good and tolerated all exercises well. Patient only difficulty with ADL's in stepping up to get on her lawn mower, and today attempted to similate a high step and patient is not ready to step up independently at this time. Patient remaining goals ongoing due to strength deficts.    Rehab Potential Excellent   PT Frequency 3x / week   PT Duration 4 weeks   PT Treatment/Interventions ADLs/Self Care Home Management;Cryotherapy;Electrical Stimulation;Functional mobility training;Stair training;Gait training;Therapeutic activities;Therapeutic exercise;Neuromuscular re-education;Patient/family education;Passive range of motion;Manual techniques;Vasopneumatic Device   PT Next Visit Plan continue with POC for strength, prone knee flexion. Modalities for edema.   Consulted and Agree with Plan of Care Patient      Patient will benefit from skilled therapeutic intervention in order to improve the following deficits and impairments:  Decreased activity tolerance, Decreased strength, Increased edema, Pain, Decreased range of motion, Abnormal gait  Visit Diagnosis: Stiffness of right knee, not elsewhere classified  Localized edema  Acute pain of left knee     Problem List Patient Active Problem List   Diagnosis Date Noted  . Primary osteoarthritis of right knee 12/21/2016  . S/P knee replacement 12/21/2016    Briannia Laba P, PTA 02/20/2017, 11:35 AM  [facility] [place], [place], [address] Phone: [phone]   Fax:  [phone]  Name: Maripaz Mullan MRN: [id number] Date of Birth: May 05, 1930

## 2017-02-22 ENCOUNTER — Ambulatory Visit: Payer: Medicare Other | Admitting: *Deleted

## 2017-02-22 DIAGNOSIS — R600 Localized edema: Secondary | ICD-10-CM

## 2017-02-22 DIAGNOSIS — M25661 Stiffness of right knee, not elsewhere classified: Secondary | ICD-10-CM

## 2017-02-22 DIAGNOSIS — M25562 Pain in left knee: Secondary | ICD-10-CM

## 2017-02-22 NOTE — Therapy (Signed)
[facility] [place], [place], [address] Phone: [phone]   Fax:  [phone]  Physical Therapy Treatment  Patient Details  Name: Holly Tucker MRN: [id number] Date of Birth: 11/23/1929 Referring Provider: Sydnee Cabal MD.  Encounter Date: 02/22/2017      PT End of Session - 02/22/17 1045    Visit Number 18   Number of Visits 24   Date for PT Re-Evaluation 05/03/17   PT Start Time 0945   PT Stop Time 6962   PT Time Calculation (min) 62 min      Past Medical History:  Diagnosis Date  . Arthritis   . Pneumonia     Past Surgical History:  Procedure Laterality Date  . ABDOMINAL HYSTERECTOMY    . APPENDECTOMY     taken when had hysterectomy  . EYE SURGERY     bilateral cataract surgery with lens implants  . TONSILLECTOMY     age 53  . TOTAL KNEE ARTHROPLASTY Right 12/21/2016   Procedure: RIGHT TOTAL KNEE ARTHROPLASTY;  Surgeon: Sydnee Cabal, MD;  Location: [facility];  Service: Orthopedics;  Laterality: Right;    There were no vitals filed for this visit.      Subjective Assessment - 02/22/17 0957    Subjective Patient repored still some ongoing soreness yet overall improvement. Doing better overall 1-2/10   Pertinent History Left knee pain.   Limitations Walking   How long can you walk comfortably? Short household distances   Patient [place]. Walk without pain   Currently in Pain? Yes   Pain Score 1    Pain Location Knee   Pain Orientation Right;Medial   Pain Descriptors / Indicators Aching   Pain Type Surgical pain   Pain Onset More than a month ago   Pain Frequency Intermittent                         [facility] Adult PT Treatment/Exercise - 02/22/17 0001      Knee/Hip Exercises: Aerobic   Nustep L 5 x 15 min     Knee/Hip Exercises: Machines for Strengthening   Cybex Knee Extension 10# 3x10 reps   Cybex Knee Flexion 40# 3x 10 reps     Knee/Hip Exercises: Standing   Forward  Lunges --  14 inch step flexion stretches   Forward Step Up Right;Step Height: 8";3 sets;10 reps   Forward Step Up Limitations 14" step up x4 to similate getting on mowewr per patient request/SBA/CGA     Knee/Hip Exercises: Prone   Hamstring Curl 10 reps;2 sets   Hamstring Curl Limitations 7.5 #     Electrical Stimulation   Electrical Stimulation Location r knee  IFC 1-_0   x 15 mins   Electrical Stimulation Goals Edema     Vasopneumatic   Number Minutes Vasopneumatic  15 minutes   Vasopnuematic Location  Knee   Vasopneumatic Pressure Medium   Vasopneumatic Temperature  44                  PT Short Term Goals - 01/28/17 1046      PT SHORT TERM GOAL #1   Title Full active right knee extension.   Time 2   Period Weeks   Status Achieved  0 deg R knee extension 01/28/2017           PT Long Term Goals - 02/18/17 1548      PT LONG TERM GOAL #1   Title  Independent with a HEP.   Period Weeks   Status Achieved     PT LONG TERM GOAL #2   Title Active right knee flexion to 115 degrees+ so the patient can perform functional tasks and do so with pain not > 2-3/10.   Period Weeks   Status Achieved     PT LONG TERM GOAL #3   Title Increase right knee strength to a solid 4+/5 to provide good stability for accomplishment of functional activities   Baseline R knee flex 4-/5  on 02/18/17   Period Weeks   Status On-going     PT LONG TERM GOAL #4   Title Decrease edema to within 2.5 cms of non-affected side to assist with pain reduction and range of motion gains.   Baseline 44.5 cm on 02/18/17   Time 8   Period Weeks   Status On-going               Plan - 02/22/17 1058    Clinical Impression Statement Pt arrived to clinic today feeling fairly well, but RT knee was feeling stiff. She was able to complete all Exs and Act.'s for RT knee with minimal increase in pain. Her PROM was a limited today with  flexion to 115 degrees due to tightness. We discussed stretching  her RT knee on a step stool over the weekend to be a little more aggressive..  No new goals met today due to deficits   Rehab Potential Excellent   PT Frequency 3x / week   PT Duration 4 weeks   PT Treatment/Interventions ADLs/Self Care Home Management;Cryotherapy;Electrical Stimulation;Functional mobility training;Stair training;Gait training;Therapeutic activities;Therapeutic exercise;Neuromuscular re-education;Patient/family education;Passive range of motion;Manual techniques;Vasopneumatic Device   PT Next Visit Plan continue with POC for strength, prone knee flexion. Modalities for edema.   PT Home Exercise Plan R hip flexor stretch off EOB   Consulted and Agree with Plan of Care Patient      Patient will benefit from skilled therapeutic intervention in order to improve the following deficits and impairments:  Decreased activity tolerance, Decreased strength, Increased edema, Pain, Decreased range of motion, Abnormal gait  Visit Diagnosis: Localized edema  Acute pain of left knee  Stiffness of right knee, not elsewhere classified     Problem List Patient Active Problem List   Diagnosis Date Noted  . Primary osteoarthritis of right knee 12/21/2016  . S/P knee replacement 12/21/2016    RAMSEUR,CHRIS, PTA 02/22/2017, 11:39 AM  [facility] [place], [place], [address] Phone: [phone]   Fax:  [phone]  Name: Holly Tucker MRN: [id number] Date of Birth: 1930-07-03

## 2017-02-25 ENCOUNTER — Ambulatory Visit: Payer: Medicare Other | Admitting: Physical Therapy

## 2017-02-25 DIAGNOSIS — M25661 Stiffness of right knee, not elsewhere classified: Secondary | ICD-10-CM

## 2017-02-25 DIAGNOSIS — R600 Localized edema: Secondary | ICD-10-CM

## 2017-02-25 DIAGNOSIS — M25562 Pain in left knee: Secondary | ICD-10-CM

## 2017-02-25 NOTE — Therapy (Signed)
[facility] [place], [place], [address] Phone: [phone]   Fax:  [phone]  Physical Therapy Treatment  Patient Details  Name: Holly Tucker MRN: [id number] Date of Birth: 1930/03/06 Referring Provider: Sydnee Cabal MD.  Encounter Date: 02/25/2017      PT End of Session - 02/25/17 1205    Activity Tolerance Patient tolerated treatment well   Behavior During Therapy [facility] for tasks assessed/performed      Past Medical History:  Diagnosis Date  . Arthritis   . Pneumonia     Past Surgical History:  Procedure Laterality Date  . ABDOMINAL HYSTERECTOMY    . APPENDECTOMY     taken when had hysterectomy  . EYE SURGERY     bilateral cataract surgery with lens implants  . TONSILLECTOMY     age 60  . TOTAL KNEE ARTHROPLASTY Right 12/21/2016   Procedure: RIGHT TOTAL KNEE ARTHROPLASTY;  Surgeon: Sydnee Cabal, MD;  Location: [facility];  Service: Orthopedics;  Laterality: Right;    There were no vitals filed for this visit.                       [facility] Adult PT Treatment/Exercise - 02/25/17 0001      Knee/Hip Exercises: Aerobic   Stationary Bike 15 minutes.     Knee/Hip Exercises: Machines for Strengthening   Cybex Knee Extension 10# x 4 minutes.   Cybex Knee Flexion 40# x 4 minutes.     Acupuncturist Location Right knee IFC   Electrical Stimulation Action IFC   Electrical Stimulation Parameters 80-150 Hz x 15 minutes.   Electrical Stimulation Goals Edema;Pain     Vasopneumatic   Number Minutes Vasopneumatic  15 minutes   Vasopnuematic Location  --  Right knee.   Vasopneumatic Pressure Medium                  PT Short Term Goals - 01/28/17 1046      PT SHORT TERM GOAL #1   Title Full active right knee extension.   Time 2   Period Weeks   Status Achieved  0 deg R knee extension 01/28/2017           PT Long Term Goals - 02/18/17 1548      PT  LONG TERM GOAL #1   Title Independent with a HEP.   Period Weeks   Status Achieved     PT LONG TERM GOAL #2   Title Active right knee flexion to 115 degrees+ so the patient can perform functional tasks and do so with pain not > 2-3/10.   Period Weeks   Status Achieved     PT LONG TERM GOAL #3   Title Increase right knee strength to a solid 4+/5 to provide good stability for accomplishment of functional activities   Baseline R knee flex 4-/5  on 02/18/17   Period Weeks   Status On-going     PT LONG TERM GOAL #4   Title Decrease edema to within 2.5 cms of non-affected side to assist with pain reduction and range of motion gains.   Baseline 44.5 cm on 02/18/17   Time 8   Period Weeks   Status On-going               Plan - 02/25/17 1119    Clinical Impression Statement Patient is starting water aerobics.      Patient will benefit from skilled therapeutic  intervention in order to improve the following deficits and impairments:     Visit Diagnosis: Localized edema  Acute pain of left knee  Stiffness of right knee, not elsewhere classified     Problem List Patient Active Problem List   Diagnosis Date Noted  . Primary osteoarthritis of right knee 12/21/2016  . S/P knee replacement 12/21/2016    Royalti Schauf, [place] MPT 02/25/2017, 12:18 PM  [facility] [place], [place], [address] Phone: [phone]   Fax:  [phone]  Name: Holly Tucker MRN: [id number] Date of Birth: 08-May-1930

## 2017-02-27 ENCOUNTER — Ambulatory Visit: Payer: Medicare Other

## 2017-02-27 DIAGNOSIS — M25661 Stiffness of right knee, not elsewhere classified: Secondary | ICD-10-CM | POA: Diagnosis not present

## 2017-02-27 DIAGNOSIS — R600 Localized edema: Secondary | ICD-10-CM

## 2017-02-27 DIAGNOSIS — M25562 Pain in left knee: Secondary | ICD-10-CM

## 2017-02-27 NOTE — Therapy (Signed)
[facility] [place], [place], [address] Phone: [phone]   Fax:  [phone]  Physical Therapy Treatment  Patient Details  Name: Holly Tucker MRN: [id number] Date of Birth: Aug 18, 1930 Referring Provider: Sydnee Cabal MD.  Encounter Date: 02/27/2017      PT End of Session - 02/27/17 1053    Visit Number 20   Number of Visits 24   Date for PT Re-Evaluation 05/03/17   PT Start Time 1030   PT Stop Time 1130   PT Time Calculation (min) 60 min   Activity Tolerance Patient tolerated treatment well   Behavior During Therapy [facility] for tasks assessed/performed      Past Medical History:  Diagnosis Date  . Arthritis   . Pneumonia     Past Surgical History:  Procedure Laterality Date  . ABDOMINAL HYSTERECTOMY    . APPENDECTOMY     taken when had hysterectomy  . EYE SURGERY     bilateral cataract surgery with lens implants  . TONSILLECTOMY     age 72  . TOTAL KNEE ARTHROPLASTY Right 12/21/2016   Procedure: RIGHT TOTAL KNEE ARTHROPLASTY;  Surgeon: Sydnee Cabal, MD;  Location: [facility];  Service: Orthopedics;  Laterality: Right;    There were no vitals filed for this visit.      Subjective Assessment - 02/27/17 1044    Patient [place]. Walk without pain   Currently in Pain? No/denies   Pain Score 0-No pain   Multiple Pain Sites No            [facility] PT Assessment - 02/27/17 1127      Observation/Other Assessments   Focus on Therapeutic Outcomes (FOTO)  31% (69% limitation)      Strength   Overall Strength Comments R knee flex 4-/5                      [facility] Adult PT Treatment/Exercise - 02/27/17 1050      Knee/Hip Exercises: Aerobic   Nustep L 5 x 12 min     Knee/Hip Exercises: Machines for Strengthening   Cybex Knee Extension 10# 2 x 15 reps   Cybex Knee Flexion 40# 2 x 12 reps      Knee/Hip Exercises: Standing   Forward Step Up Right;Step Height: 8";10 reps;2 sets   Forward  Step Up Limitations x 10 reps each LE    Step Down 2 sets;Right;Step Height: 4";Hand Hold: 2   Step Down Limitations heel touch in parallel bars      Knee/Hip Exercises: Seated   Long Arc Quad Strengthening;Right;Weights;1 set;15 reps   Long Arc Quad Weight 2 lbs.   Long [employer] Limitations 5" hold at top   Sit to [employer] 1 set;10 reps;with UE support  from low chair with airex pad      Acupuncturist Stimulation Location Right knee IFC   Electrical Stimulation Action IFC   Electrical Stimulation Parameters 80-150Hz , intensity to pt. tolerance, 15'   Electrical Stimulation Goals Edema;Pain     Vasopneumatic   Number Minutes Vasopneumatic  15 minutes   Vasopnuematic Location  Knee  R    Vasopneumatic Pressure Medium   Vasopneumatic Temperature  lowest temp.                   PT Short Term Goals - 01/28/17 1046      PT SHORT TERM GOAL #1   Title Full active right knee extension.  Time 2   Period Weeks   Status Achieved  0 deg R knee extension 01/28/2017           PT Long Term Goals - 2017-03-17 1131      PT LONG TERM GOAL #1   Title Independent with a HEP.   Period Weeks   Status Achieved     PT LONG TERM GOAL #2   Title Active right knee flexion to 115 degrees+ so the patient can perform functional tasks and do so with pain not > 2-3/10.   Period Weeks   Status Achieved     PT LONG TERM GOAL #3   Title Increase right knee strength to a solid 4+/5 to provide good stability for accomplishment of functional activities   Baseline R knee flex 4-/5  on 03/17/2017   Period Weeks   Status On-going     PT LONG TERM GOAL #4   Title Decrease edema to within 2.5 cms of non-affected side to assist with pain reduction and range of motion gains.   Baseline 44.5 cm on 02/18/17   Time 8   Period Weeks   Status On-going               Plan - 2017-03-17 1145    Clinical Impression Statement Today pt. 20th visit in [place].  Pt. verbalizing she  still feels like she is improving with therapy despite a decreased FOTO score.  Still demonstrating hip/knee weakness and difficulty with rising from low chair today in treatment.  Hip flexion still 4-/5 with testing and some visible swelling present at R knee.  Pt. however able to perform all strengthening therex today with only mild pain increase.  Pt. able to perform stepping to 8" step with minimal pain increase and less UE support today.  Pt. will continue to benefit from further skilled therapy to target remained deficits.      PT Treatment/Interventions ADLs/Self Care Home Management;Cryotherapy;Electrical Stimulation;Functional mobility training;Stair training;Gait training;Therapeutic activities;Therapeutic exercise;Neuromuscular re-education;Patient/family education;Passive range of motion;Manual techniques;Vasopneumatic Device   PT Next Visit Plan continue with POC for strength, prone knee flexion. Modalities for edema.      Patient will benefit from skilled therapeutic intervention in order to improve the following deficits and impairments:  Decreased activity tolerance, Decreased strength, Increased edema, Pain, Decreased range of motion, Abnormal gait  Visit Diagnosis: Localized edema  Acute pain of left knee  Stiffness of right knee, not elsewhere classified       G-Codes - 17-Mar-2017 1144    Functional Assessment Tool Used (Outpatient Only) FOTO: 31% (69% limitation)       Problem List Patient Active Problem List   Diagnosis Date Noted  . Primary osteoarthritis of right knee 12/21/2016  . S/P knee replacement 12/21/2016    Bess Harvest, PTA 2017-03-17 12:06 PM  [facility] [place], [place], [address] Phone: [phone]   Fax:  [phone]  Name: Holly Tucker MRN: [id number] Date of Birth: 11-01-1930

## 2017-03-01 ENCOUNTER — Ambulatory Visit: Payer: Medicare Other | Admitting: Physical Therapy

## 2017-03-01 ENCOUNTER — Encounter: Payer: Self-pay | Admitting: Physical Therapy

## 2017-03-01 DIAGNOSIS — R600 Localized edema: Secondary | ICD-10-CM

## 2017-03-01 DIAGNOSIS — M25661 Stiffness of right knee, not elsewhere classified: Secondary | ICD-10-CM

## 2017-03-01 DIAGNOSIS — M25562 Pain in left knee: Secondary | ICD-10-CM

## 2017-03-01 NOTE — Therapy (Signed)
[place], [place], [address] Phone: [phone]   Fax:  [phone]  Physical Therapy Treatment  Patient Details  Name: Holly Tucker MRN: [id number] Date of Birth: 03-Oct-1930 Referring Provider: Sydnee Cabal MD.  Encounter Date: 03/01/2017      PT End of Session - 03/01/17 1035    Visit Number 21   Number of Visits 24   Date for PT Re-Evaluation 05/03/17   PT Start Time 1030   PT Stop Time 1126   PT Time Calculation (min) 56 min   Activity Tolerance Patient tolerated treatment well   Behavior During Therapy [facility] for tasks assessed/performed      Past Medical History:  Diagnosis Date  . Arthritis   . Pneumonia     Past Surgical History:  Procedure Laterality Date  . ABDOMINAL HYSTERECTOMY    . APPENDECTOMY     taken when had hysterectomy  . EYE SURGERY     bilateral cataract surgery with lens implants  . TONSILLECTOMY     age 76  . TOTAL KNEE ARTHROPLASTY Right 12/21/2016   Procedure: RIGHT TOTAL KNEE ARTHROPLASTY;  Surgeon: Sydnee Cabal, MD;  Location: [facility];  Service: Orthopedics;  Laterality: Right;    There were no vitals filed for this visit.      Subjective Assessment - 03/01/17 1034    Subjective States that her knee feels good and got in the pool yesterday at the [facility].   Pertinent History Left knee pain.   Limitations Walking   How long can you walk comfortably? Short household distances   Patient [place]. Walk without pain   Currently in Pain? No/denies            [facility] PT Assessment - 03/01/17 0001      Assessment   Medical Diagnosis R TKR   Onset Date/Surgical Date 12/21/16   Next MD Visit 03/15/2017     Restrictions   Weight Bearing Restrictions No     Circumferential Edema   Circumferential - Right 45 cm   Circumferential - Left  42.6 cm                     [facility] Adult PT Treatment/Exercise - 03/01/17 0001      Ambulation/Gait   Stairs  Yes   Stairs Assistance 6: Modified independent (Device/Increase time)   Stair Management Technique One rail Right;Alternating pattern;Forwards   Number of Stairs 4  x4 RT   Height of Stairs 7     Knee/Hip Exercises: Aerobic   Nustep L5, seat 11 x15 min     Knee/Hip Exercises: Standing   Step Down Right;2 sets;10 reps;Hand Hold: 1;Step Height: 4"   Step Down Limitations Heel dot   Rocker Board 2 minutes     Knee/Hip Exercises: Seated   Clamshell with TheraBand Green  x30 reps for each LE     Modalities   Modalities Passenger transport manager Location R knee   Electrical Stimulation Action IFC   Electrical Stimulation Parameters 1-10 hz x15 min   Electrical Stimulation Goals Edema     Vasopneumatic   Number Minutes Vasopneumatic  15 minutes   Vasopnuematic Location  Knee   Vasopneumatic Pressure Medium   Vasopneumatic Temperature  34                  PT Short Term Goals - 01/28/17 1046  PT SHORT TERM GOAL #1   Title Full active right knee extension.   Time 2   Period Weeks   Status Achieved  0 deg R knee extension 01/28/2017           PT Long Term Goals - 03/01/17 1113      PT LONG TERM GOAL #1   Title Independent with a HEP.   Period Weeks   Status Achieved     PT LONG TERM GOAL #2   Title Active right knee flexion to 115 degrees+ so the patient can perform functional tasks and do so with pain not > 2-3/10.   Period Weeks   Status Achieved     PT LONG TERM GOAL #3   Title Increase right knee strength to a solid 4+/5 to provide good stability for accomplishment of functional activities   Baseline R knee flex 4-/5  on 02/27/17   Period Weeks   Status On-going     PT LONG TERM GOAL #4   Title Decrease edema to within 2.5 cms of non-affected side to assist with pain reduction and range of motion gains.   Baseline 44.5 cm on 02/18/17   Time 8   Period Weeks   Status Achieved   2.4 cm difference R > L 03/01/2017     PT LONG TERM GOAL #5   Title Perform a reciprocating stair gait with one railing with pain not > 2-3/10.   Time 8   Period Weeks   Status Achieved               Plan - 03/01/17 1113    Clinical Impression Statement Patient presented in clinic today with her R knee feeling "good." Patient guided through exercises in which resistance was increased as needed.  Patient able to achieve goal regarding stairs today although she is very careful secondary to fear of falling. Patient also able to achieve goal regarding edema as well with 2.4 cm difference with R > L. Seated clamshell intiated today with resistance to assist patient in getting into and out of the car better. Normal modalities response noted following removal of the modalities.   Rehab Potential Excellent   PT Frequency 3x / week   PT Duration 4 weeks   PT Treatment/Interventions ADLs/Self Care Home Management;Cryotherapy;Electrical Stimulation;Functional mobility training;Stair training;Gait training;Therapeutic activities;Therapeutic exercise;Neuromuscular re-education;Patient/family education;Passive range of motion;Manual techniques;Vasopneumatic Device   PT Next Visit Plan continue with POC for strength, prone knee flexion. Modalities for edema.   PT Home Exercise Plan R hip flexor stretch off EOB   Consulted and Agree with Plan of Care Patient      Patient will benefit from skilled therapeutic intervention in order to improve the following deficits and impairments:  Decreased activity tolerance, Decreased strength, Increased edema, Pain, Decreased range of motion, Abnormal gait  Visit Diagnosis: Localized edema  Acute pain of left knee  Stiffness of right knee, not elsewhere classified     Problem List Patient Active Problem List   Diagnosis Date Noted  . Primary osteoarthritis of right knee 12/21/2016  . S/P knee replacement 12/21/2016    Wynelle Fanny,  PTA 03/01/2017, 11:33 AM  [facility] [place], [place], [address] Phone: [phone]   Fax:  [phone]  Name: Holly Tucker MRN: [id number] Date of Birth: 01-25-1930

## 2017-03-04 ENCOUNTER — Ambulatory Visit: Payer: Medicare Other | Admitting: Physical Therapy

## 2017-03-04 ENCOUNTER — Encounter: Payer: Self-pay | Admitting: Physical Therapy

## 2017-03-04 DIAGNOSIS — R600 Localized edema: Secondary | ICD-10-CM

## 2017-03-04 DIAGNOSIS — M25661 Stiffness of right knee, not elsewhere classified: Secondary | ICD-10-CM | POA: Diagnosis not present

## 2017-03-04 DIAGNOSIS — M25562 Pain in left knee: Secondary | ICD-10-CM

## 2017-03-04 NOTE — Patient Instructions (Addendum)
Strengthening: Straight Leg Raise (Phase 1)    Tighten muscles on front of right thigh, then lift leg __5__ inches from surface, keeping knee locked.  Repeat __10__ times per set. Do _2-3___ sets per session. Do _2-3___ sessions per day.  http://orth.exer.us/614   Copyright  VHI. All rights reserved.  Hip Abduction / Adduction: with Knee Flexion (Supine)    Strengthening: Hip Abductor - Resisted    With band looped around both legs above knees, push thighs apart. Repeat _10___ times per set. Do _2___ sets per session. Do __2-3__ sessions per day.  http://orth.exer.us/688   Copyright  VHI. All rights reserved.  Self-Mobilization: Heel Slide (Supine)    Slide right heel toward buttocks until a gentle stretch is felt. Hold __5__ seconds. Relax. Repeat __10__ times per set. Do _2___ sets per session. Do _2-3___ sessions per day.  http://orth.exer.us/710   Copyright  VHI. All rights reserved.
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
## 2017-03-06 ENCOUNTER — Ambulatory Visit: Payer: Medicare Other | Admitting: Physical Therapy

## 2017-03-06 ENCOUNTER — Encounter: Payer: Self-pay | Admitting: Physical Therapy

## 2017-03-06 DIAGNOSIS — M25661 Stiffness of right knee, not elsewhere classified: Secondary | ICD-10-CM | POA: Diagnosis not present

## 2017-03-06 DIAGNOSIS — R600 Localized edema: Secondary | ICD-10-CM

## 2017-03-06 DIAGNOSIS — M25562 Pain in left knee: Secondary | ICD-10-CM

## 2017-03-06 NOTE — Therapy (Signed)
[facility] [place], [place], [address] Phone: [phone]   Fax:  [phone]  Physical Therapy Treatment  Patient Details  Name: Holly Tucker MRN: [id number] Date of Birth: 10-Mar-1930 Referring Provider: Sydnee Cabal MD.  Encounter Date: 03/06/2017      PT End of Session - 03/06/17 1030    Visit Number 23   Number of Visits 24   Date for PT Re-Evaluation 05/03/17   PT Start Time 1032   PT Stop Time 1120   PT Time Calculation (min) 48 min   Activity Tolerance Patient tolerated treatment well   Behavior During Therapy [facility] for tasks assessed/performed      Past Medical History:  Diagnosis Date  . Arthritis   . Pneumonia     Past Surgical History:  Procedure Laterality Date  . ABDOMINAL HYSTERECTOMY    . APPENDECTOMY     taken when had hysterectomy  . EYE SURGERY     bilateral cataract surgery with lens implants  . TONSILLECTOMY     age 14  . TOTAL KNEE ARTHROPLASTY Right 12/21/2016   Procedure: RIGHT TOTAL KNEE ARTHROPLASTY;  Surgeon: Sydnee Cabal, MD;  Location: [facility];  Service: Orthopedics;  Laterality: Right;    There were no vitals filed for this visit.      Subjective Assessment - 03/06/17 1030    Subjective Reports that she rode a bike for an hour yesterday but that has made her knee sore. Reports that she rubbed it with horse linament.   Pertinent History Left knee pain.   Limitations Walking   How long can you walk comfortably? Short household distances   Patient [place]. Walk without pain   Currently in Pain? Yes   Pain Score 2    Pain Location Knee   Pain Orientation Right   Pain Descriptors / Indicators Sore   Pain Type Surgical pain   Pain Onset More than a month ago            [facility] PT Assessment - 03/06/17 0001      Assessment   Medical Diagnosis R TKR   Onset Date/Surgical Date 12/21/16   Next MD Visit 03/15/2017     Restrictions   Weight Bearing Restrictions  No                     [facility] Adult PT Treatment/Exercise - 03/06/17 0001      Knee/Hip Exercises: Aerobic   Nustep L5, seat 10 x10 min     Knee/Hip Exercises: Machines for Strengthening   Cybex Knee Extension 10# 3x10 reps   Cybex Knee Flexion 40# 3x10 reps     Knee/Hip Exercises: Standing   Forward Lunges Right;2 sets;10 reps;3 seconds   Forward Lunges Limitations 8" step   Forward Step Up Right;3 sets;10 reps;Hand Hold: 2;Step Height: 8"     Knee/Hip Exercises: Seated   Clamshell with TheraBand Green  3x10 reps     Knee/Hip Exercises: Supine   Straight Leg Raises AROM;Right;2 sets;10 reps     Knee/Hip Exercises: Sidelying   Hip ABduction Strengthening;Right;2 sets;10 reps     Modalities   Modalities Electrical Stimulation;Vasopneumatic     Acupuncturist Location R knee   Electrical Stimulation Action IFC   Electrical Stimulation Parameters 1-10 hz x15 min   Electrical Stimulation Goals Edema;Pain     Vasopneumatic   Number Minutes Vasopneumatic  15 minutes   Vasopnuematic Location  Knee  Vasopneumatic Pressure Medium   Vasopneumatic Temperature  34                  PT Short Term Goals - 01/28/17 1046      PT SHORT TERM GOAL #1   Title Full active right knee extension.   Time 2   Period Weeks   Status Achieved  0 deg R knee extension 01/28/2017           PT Long Term Goals - 03/01/17 1113      PT LONG TERM GOAL #1   Title Independent with a HEP.   Period Weeks   Status Achieved     PT LONG TERM GOAL #2   Title Active right knee flexion to 115 degrees+ so the patient can perform functional tasks and do so with pain not > 2-3/10.   Period Weeks   Status Achieved     PT LONG TERM GOAL #3   Title Increase right knee strength to a solid 4+/5 to provide good stability for accomplishment of functional activities   Baseline R knee flex 4-/5  on 02/27/17   Period Weeks   Status On-going     PT  LONG TERM GOAL #4   Title Decrease edema to within 2.5 cms of non-affected side to assist with pain reduction and range of motion gains.   Baseline 44.5 cm on 02/18/17   Time 8   Period Weeks   Status Achieved  2.4 cm difference R > L 03/01/2017     PT LONG TERM GOAL #5   Title Perform a reciprocating stair gait with one railing with pain not > 2-3/10.   Time 8   Period Weeks   Status Achieved               Plan - 03/06/17 1113    Clinical Impression Statement Patient presented in clinic with reports of soreness after being on stationary bike for prolonged period of time. Patient guided through various hip/knee strengthening exercises without complaint of pain. Only fatigue noted especially with supine and sidelying activities. Weakness prominant in SL R hip abduction today. Normal modalities response noted following removal of the modalities.   Rehab Potential Excellent   PT Frequency 3x / week   PT Duration 4 weeks   PT Treatment/Interventions ADLs/Self Care Home Management;Cryotherapy;Electrical Stimulation;Functional mobility training;Stair training;Gait training;Therapeutic activities;Therapeutic exercise;Neuromuscular re-education;Patient/family education;Passive range of motion;Manual techniques;Vasopneumatic Device   PT Next Visit Plan D/C next treatment.   PT Home Exercise Plan R hip flexor stretch off EOB   Consulted and Agree with Plan of Care Patient      Patient will benefit from skilled therapeutic intervention in order to improve the following deficits and impairments:  Decreased activity tolerance, Decreased strength, Increased edema, Pain, Decreased range of motion, Abnormal gait  Visit Diagnosis: Localized edema  Acute pain of left knee  Stiffness of right knee, not elsewhere classified     Problem List Patient Active Problem List   Diagnosis Date Noted  . Primary osteoarthritis of right knee 12/21/2016  . S/P knee replacement 12/21/2016    Wynelle Fanny, PTA 03/06/2017, 11:32 AM  [facility] [place], [place], [address] Phone: [phone]   Fax:  [phone]  Name: Shavonn Convey MRN: [id number] Date of Birth: 1930/02/11

## 2017-03-08 ENCOUNTER — Encounter: Payer: Self-pay | Admitting: Physical Therapy

## 2017-03-08 ENCOUNTER — Ambulatory Visit: Payer: Medicare Other | Admitting: Physical Therapy

## 2017-03-08 DIAGNOSIS — M25661 Stiffness of right knee, not elsewhere classified: Secondary | ICD-10-CM

## 2017-03-08 DIAGNOSIS — M25562 Pain in left knee: Secondary | ICD-10-CM

## 2017-03-08 DIAGNOSIS — R600 Localized edema: Secondary | ICD-10-CM

## 2017-03-08 NOTE — Therapy (Signed)
[facility] [place], [place], [address] Phone: [phone]   Fax:  [phone]  Physical Therapy Treatment  Patient Details  Name: Holly Tucker MRN: [id number] Date of Birth: 09/27/1930 Referring Provider: Sydnee Cabal MD.  Encounter Date: 03/08/2017      PT End of Session - 03/08/17 1041    Visit Number 24   Number of Visits 24   Date for PT Re-Evaluation 05/03/17   PT Start Time 1031   PT Stop Time 1130   PT Time Calculation (min) 59 min   Activity Tolerance Patient tolerated treatment well   Behavior During Therapy [facility] for tasks assessed/performed      Past Medical History:  Diagnosis Date  . Arthritis   . Pneumonia     Past Surgical History:  Procedure Laterality Date  . ABDOMINAL HYSTERECTOMY    . APPENDECTOMY     taken when had hysterectomy  . EYE SURGERY     bilateral cataract surgery with lens implants  . TONSILLECTOMY     age 40  . TOTAL KNEE ARTHROPLASTY Right 12/21/2016   Procedure: RIGHT TOTAL KNEE ARTHROPLASTY;  Surgeon: Sydnee Cabal, MD;  Location: [facility];  Service: Orthopedics;  Laterality: Right;    There were no vitals filed for this visit.      Subjective Assessment - 03/08/17 1040    Subjective Reports that her knee feels better today.   Pertinent History Left knee pain.   Limitations Walking   How long can you walk comfortably? Short household distances   Patient [place]. Walk without pain   Currently in Pain? Yes   Pain Score 1    Pain Location Knee  Patella   Pain Orientation Right   Pain Descriptors / Indicators Discomfort   Pain Type Surgical pain   Pain Onset More than a month ago   Aggravating Factors  R knee flexion            [facility] PT Assessment - 03/08/17 0001      Assessment   Medical Diagnosis R TKR   Onset Date/Surgical Date 12/21/16   Next MD Visit 03/15/2017     Restrictions   Weight Bearing Restrictions No     ROM / Strength   AROM /  PROM / Strength AROM;Strength     AROM   Overall AROM  Within functional limits for tasks performed   AROM Assessment Site Knee   Right/Left Knee Right   Right Knee Extension 0   Right Knee Flexion 115     Strength   Overall Strength Within functional limits for tasks performed   Strength Assessment Site Knee   Right/Left Knee Right   Right Knee Flexion 4+/5   Right Knee Extension 5/5                     [facility] Adult PT Treatment/Exercise - 03/08/17 0001      Knee/Hip Exercises: Aerobic   Stationary Bike L1 x15 min     Knee/Hip Exercises: Machines for Strengthening   Cybex Knee Extension 10# 3x10 reps   Cybex Knee Flexion 40# 3x10 reps     Knee/Hip Exercises: Standing   Forward Lunges Right;2 sets;10 reps;3 seconds   Forward Lunges Limitations 8" step   Forward Step Up Right;2 sets;10 reps;Hand Hold: 2;Step Height: 8"   Rocker Board 3 minutes     Knee/Hip Exercises: Supine   Straight Leg Raises AROM;Right;2 sets;10 reps     Knee/Hip  Exercises: Sidelying   Hip ABduction Strengthening;Right;2 sets;10 reps     Modalities   Modalities Passenger transport manager Location R knee   Electrical Stimulation Action IFC   Electrical Stimulation Parameters 1-10 hz x15 min   Electrical Stimulation Goals Edema;Pain     Vasopneumatic   Number Minutes Vasopneumatic  15 minutes   Vasopnuematic Location  Knee   Vasopneumatic Pressure Medium   Vasopneumatic Temperature  48                  PT Short Term Goals - 01/28/17 1046      PT SHORT TERM GOAL #1   Title Full active right knee extension.   Time 2   Period Weeks   Status Achieved  0 deg R knee extension 01/28/2017           PT Long Term Goals - 03/08/17 1114      PT LONG TERM GOAL #1   Title Independent with a HEP.   Period Weeks   Status Achieved     PT LONG TERM GOAL #2   Title Active right knee flexion to 115 degrees+ so  the patient can perform functional tasks and do so with pain not > 2-3/10.   Period Weeks   Status Achieved     PT LONG TERM GOAL #3   Title Increase right knee strength to a solid 4+/5 to provide good stability for accomplishment of functional activities   Baseline R knee flex 4-/5  on 02/27/17   Period Weeks   Status Achieved  R knee ext 5/5, R knee flex 4+/5 03/08/2017     PT LONG TERM GOAL #4   Title Decrease edema to within 2.5 cms of non-affected side to assist with pain reduction and range of motion gains.   Baseline 44.5 cm on 02/18/17   Time 8   Period Weeks   Status Achieved  2.4 cm difference R > L 03/01/2017     PT LONG TERM GOAL #5   Title Perform a reciprocating stair gait with one railing with pain not > 2-3/10.   Time 8   Period Weeks   Status Achieved               Plan - 03/08/17 1126    Clinical Impression Statement Patient presented in clinic with reports very low level R knee and then discomfort with stationary bike with knee flexion. Patient has progressed well with knee strengthening but R hip strength notable during treatment especially with hip abduction in sidelying. AROM R knee measured as 0-115 deg with R knee flexion 4+/5 and R knee extension 5/5. Patient experiences discomfort predominately along the medial R knee. Patient encouraged to continue HEPs and pool activties at [facility].   Rehab Potential Excellent   PT Frequency 3x / week   PT Duration 4 weeks   PT Treatment/Interventions ADLs/Self Care Home Management;Cryotherapy;Electrical Stimulation;Functional mobility training;Stair training;Gait training;Therapeutic activities;Therapeutic exercise;Neuromuscular re-education;Patient/family education;Passive range of motion;Manual techniques;Vasopneumatic Device   PT Next Visit Plan D/C summary required.   PT Home Exercise Plan R hip flexor stretch off EOB   Consulted and Agree with Plan of Care Patient      Patient will benefit from skilled  therapeutic intervention in order to improve the following deficits and impairments:  Decreased activity tolerance, Decreased strength, Increased edema, Pain, Decreased range of motion, Abnormal gait  Visit Diagnosis: Localized edema  Acute pain of left  knee  Stiffness of right knee, not elsewhere classified     Problem List Patient Active Problem List   Diagnosis Date Noted  . Primary osteoarthritis of right knee 12/21/2016  . S/P knee replacement 12/21/2016    Ahmed Prima, PTA 03/08/17 12:10 PM  [facility] [place], [place], [address] Phone: [phone]   Fax:  [phone]  Name: Holly Tucker MRN: [id number] Date of Birth: [birth year]

## 2017-03-12 NOTE — Therapy (Signed)
[facility] [place], [place], [address] Phone: [phone]   Fax:  [phone]  Physical Therapy Treatment  Patient Details  Name: Holly Tucker MRN: [id number] Date of Birth: [birth year] Referring Provider: Sydnee Cabal MD.  Encounter Date: 03/08/2017    Past Medical History:  Diagnosis Date  . Arthritis   . Pneumonia     Past Surgical History:  Procedure Laterality Date  . ABDOMINAL HYSTERECTOMY    . APPENDECTOMY     taken when had hysterectomy  . EYE SURGERY     bilateral cataract surgery with lens implants  . TONSILLECTOMY     age 45  . TOTAL KNEE ARTHROPLASTY Right 12/21/2016   Procedure: RIGHT TOTAL KNEE ARTHROPLASTY;  Surgeon: Sydnee Cabal, MD;  Location: [facility];  Service: Orthopedics;  Laterality: Right;    There were no vitals filed for this visit.                                 PT Short Term Goals - 01/28/17 1046      PT SHORT TERM GOAL #1   Title Full active right knee extension.   Time 2   Period Weeks   Status Achieved  0 deg R knee extension 01/28/2017           PT Long Term Goals - 03/08/17 1114      PT LONG TERM GOAL #1   Title Independent with a HEP.   Period Weeks   Status Achieved     PT LONG TERM GOAL #2   Title Active right knee flexion to 115 degrees+ so the patient can perform functional tasks and do so with pain not > 2-3/10.   Period Weeks   Status Achieved     PT LONG TERM GOAL #3   Title Increase right knee strength to a solid 4+/5 to provide good stability for accomplishment of functional activities   Baseline R knee flex 4-/5  on 02/27/17   Period Weeks   Status Achieved  R knee ext 5/5, R knee flex 4+/5 03/08/2017     PT LONG TERM GOAL #4   Title Decrease edema to within 2.5 cms of non-affected side to assist with pain reduction and range of motion gains.   Baseline 44.5 cm on 02/18/17   Time 8   Period Weeks   Status Achieved  2.4 cm  difference R > L 03/01/2017     PT LONG TERM GOAL #5   Title Perform a reciprocating stair gait with one railing with pain not > 2-3/10.   Time 8   Period Weeks   Status Achieved             Patient will benefit from skilled therapeutic intervention in order to improve the following deficits and impairments:  Decreased activity tolerance, Decreased strength, Increased edema, Pain, Decreased range of motion, Abnormal gait  Visit Diagnosis: Localized edema  Acute pain of left knee  Stiffness of right knee, not elsewhere classified       G-Codes - 03/31/2017 0925    Functional Assessment Tool Used (Outpatient Only) --      Problem List Patient Active Problem List   Diagnosis Date Noted  . Primary osteoarthritis of right knee 12/21/2016  . S/P knee replacement 12/21/2016    PHYSICAL THERAPY DISCHARGE SUMMARY  Visits from Start of Care:   Current functional level related to goals / functional  outcomes: See above.   Remaining deficits: All goals met.   Education / Equipment: HEP. Plan: Patient agrees to discharge.  Patient goals were met. Patient is being discharged due to meeting the stated rehab goals.  ?????       Hezzie Karim, [place] MPT 03/12/2017, 9:25 AM  [facility] [address] [place], [place], [address] Phone: [phone]   Fax:  [phone]  Name: Danine Hor MRN: [id number] Date of Birth: [birth year]

## 2017-05-20 ENCOUNTER — Emergency Department (HOSPITAL_COMMUNITY): Payer: Medicare Other

## 2017-05-20 ENCOUNTER — Emergency Department (HOSPITAL_COMMUNITY)
Admission: EM | Admit: 2017-05-20 | Discharge: 2017-05-20 | Disposition: A | Discharge status: Home / Self Care | Payer: Medicare Other | Attending: Emergency Medicine | Admitting: Emergency Medicine

## 2017-05-20 ENCOUNTER — Encounter (HOSPITAL_COMMUNITY): Payer: Self-pay

## 2017-05-20 DIAGNOSIS — Z79899 Other long term (current) drug therapy: Secondary | ICD-10-CM | POA: Diagnosis not present

## 2017-05-20 DIAGNOSIS — Y999 Unspecified external cause status: Secondary | ICD-10-CM | POA: Insufficient documentation

## 2017-05-20 DIAGNOSIS — Z96651 Presence of right artificial knee joint: Secondary | ICD-10-CM | POA: Diagnosis not present

## 2017-05-20 DIAGNOSIS — Z7982 Long term (current) use of aspirin: Secondary | ICD-10-CM | POA: Diagnosis not present

## 2017-05-20 DIAGNOSIS — S098XXA Other specified injuries of head, initial encounter: Secondary | ICD-10-CM | POA: Insufficient documentation

## 2017-05-20 DIAGNOSIS — Y929 Unspecified place or not applicable: Secondary | ICD-10-CM | POA: Insufficient documentation

## 2017-05-20 DIAGNOSIS — W1830XA Fall on same level, unspecified, initial encounter: Secondary | ICD-10-CM | POA: Insufficient documentation

## 2017-05-20 DIAGNOSIS — S34109A Unspecified injury to unspecified level of lumbar spinal cord, initial encounter: Secondary | ICD-10-CM | POA: Diagnosis present

## 2017-05-20 DIAGNOSIS — S32010A Wedge compression fracture of first lumbar vertebra, initial encounter for closed fracture: Secondary | ICD-10-CM

## 2017-05-20 DIAGNOSIS — S0990XA Unspecified injury of head, initial encounter: Secondary | ICD-10-CM

## 2017-05-20 DIAGNOSIS — Y939 Activity, unspecified: Secondary | ICD-10-CM | POA: Insufficient documentation

## 2017-05-20 DIAGNOSIS — S32019A Unspecified fracture of first lumbar vertebra, initial encounter for closed fracture: Secondary | ICD-10-CM | POA: Diagnosis not present

## 2017-05-20 MED ORDER — ACETAMINOPHEN 325 MG PO TABS
650.0000 mg | ORAL_TABLET | Freq: Once | ORAL | Status: AC
  Administered 2017-05-20: 650 mg via ORAL
  Filled 2017-05-20: qty 2

## 2017-05-20 MED ORDER — BACLOFEN 20 MG PO TABS: 20.0000 mg | ORAL_TABLET | Freq: Every day | ORAL | 1 refills | Status: AC

## 2017-05-20 MED ORDER — HYDROCODONE-ACETAMINOPHEN 5-325 MG PO TABS: 1.0000 | ORAL_TABLET | Freq: Four times a day (QID) | ORAL | 0 refills | Status: DC | PRN

## 2017-05-20 NOTE — ED Provider Notes (Signed)
[facility] DEPT Provider Note   CSN: [id number] Arrival date & time: 05/20/17  1947     History   Chief Complaint Chief Complaint  Patient presents with  . Fall    HPI Holly Tucker is a 83 y.o. female.  83 year old female who had a mechanical fall just prior to arrival. Duke Salvia the back her head and did not have any loss of consciousness. Denies any neck discomfort. Does complain of sharp lower lumbar spinal pain that does not radiate to her legs. No bowel or bladder dysfunction. No chest or abdominal discomfort. No weakness in arms or legs. Symptoms better with rest and worse with movement. No treatment use prior to arrival.      Past Medical History:  Diagnosis Date  . Arthritis   . Pneumonia     Patient Active Problem List   Diagnosis Date Noted  . Primary osteoarthritis of right knee 12/21/2016  . S/P knee replacement 12/21/2016    Past Surgical History:  Procedure Laterality Date  . ABDOMINAL HYSTERECTOMY    . APPENDECTOMY     taken when had hysterectomy  . EYE SURGERY     bilateral cataract surgery with lens implants  . TONSILLECTOMY     age 73  . TOTAL KNEE ARTHROPLASTY Right 12/21/2016   Procedure: RIGHT TOTAL KNEE ARTHROPLASTY;  Surgeon: Sydnee Cabal, MD;  Location: [facility];  Service: Orthopedics;  Laterality: Right;    OB History    No data available       Home Medications    Prior to Admission medications   Medication Sig Start Date End Date Taking? Authorizing Provider  acetaminophen (TYLENOL) 500 MG tablet Take 1,000 mg by mouth 3 (three) times daily as needed for headache (pain).   Yes [provider]  amitriptyline (ELAVIL) 50 MG tablet Take 100 mg by mouth at bedtime.   Yes [provider]  baclofen (LIORESAL) 20 MG tablet Take 20 mg by mouth at bedtime. 04/01/17  Yes [provider]  BIOTIN 5000 PO Take 5,000 mcg by mouth at bedtime.    Yes [provider]  Coenzyme Q10 (COQ-10) 200 MG CAPS Take 200 mg  by mouth at bedtime.    Yes [provider]  gabapentin (NEURONTIN) 300 MG capsule Take 300 mg by mouth 3 (three) times daily.   Yes [provider]  Glucosamine HCl-MSM (GLUCOSAMINE-MSM PO) Take 1 tablet by mouth 2 (two) times daily.   Yes [provider]  meloxicam (MOBIC) 15 MG tablet Take 15 mg by mouth at bedtime.    Yes [provider]  Multiple Vitamin (MULTIVITAMIN WITH MINERALS) TABS tablet Take 1 tablet by mouth daily.   Yes [provider]  PRESCRIPTION MEDICATION Apply 1 g topically See admin instructions. Pain cream compounded by [facility], [place], [place]: apply 1 gram topically twice daily for pain, may also use a third time as needed for pain.   Yes [provider]  aspirin EC 325 MG tablet Take 1 tablet (325 mg total) by mouth 2 (two) times daily. Patient not taking: Reported on 05/20/2017 12/21/16   Lajean Manes, PA-C  baclofen (LIORESAL) 10 MG tablet Take 1 tablet (10 mg total) by mouth 3 (three) times daily. Patient not taking: Reported on 05/20/2017 12/21/16   Wyatt Portela L, PA-C  oxyCODONE (ROXICODONE) 5 MG immediate release tablet Take 1-2 tablets (5-10 mg total) by mouth every 4 (four) hours as needed for severe pain. Patient not taking: Reported on 01/03/2017  12/21/16   Stilwell, Sueanne Margarita, PA-C    Family History No family history on file.  Social History Social History  Substance Use Topics  . Smoking status: Never Smoker  . Smokeless tobacco: Never Used  . Alcohol use No     Allergies   Sulfa antibiotics   Review of Systems Review of Systems  All other systems reviewed and are negative.    Physical Exam Updated Vital Signs BP (!) 143/71 (BP Location: Right Arm)   Pulse 78   Temp 98.4 F (36.9 C) (Oral)   Resp 16   Ht 1.676 m (5\' 6" )   Wt 84.8 kg (187 lb)   SpO2 99%   BMI 30.18 kg/m   Physical Exam  Constitutional: She is oriented to person, place, and time. She  appears well-developed and well-nourished.  Non-toxic appearance. No distress.  HENT:  Head:    Eyes: Conjunctivae, EOM and lids are normal. Pupils are equal, round, and reactive to light.  Neck: Normal range of motion. Neck supple. No tracheal deviation present. No thyroid mass present.  Cardiovascular: Normal rate, regular rhythm and normal heart sounds.  Exam reveals no gallop.   No murmur heard. Pulmonary/Chest: Effort normal and breath sounds normal. No stridor. No respiratory distress. She has no decreased breath sounds. She has no wheezes. She has no rhonchi. She has no rales.  Abdominal: Soft. Normal appearance and bowel sounds are normal. She exhibits no distension. There is no tenderness. There is no rebound and no CVA tenderness.  Musculoskeletal: Normal range of motion. She exhibits no edema or tenderness.       Back:  Neurological: She is alert and oriented to person, place, and time. She has normal strength. No cranial nerve deficit or sensory deficit. GCS eye subscore is 4. GCS verbal subscore is 5. GCS motor subscore is 6.  Skin: Skin is warm and dry. No abrasion and no rash noted.  Psychiatric: She has a normal mood and affect. Her speech is normal and behavior is normal.  Nursing note and vitals reviewed.    ED Treatments / Results  Labs (all labs ordered are listed, but only abnormal results are displayed) Labs Reviewed - No data to display  EKG  EKG Interpretation None       Radiology No results found.  Procedures Procedures (including critical care time)  Medications Ordered in ED Medications - No data to display   Initial Impression / Assessment and Plan / ED Course  I have reviewed the triage vital signs and the nursing notes.  Pertinent labs & imaging results that were available during my care of the patient were reviewed by me and considered in my medical decision making (see chart for details).     X-ray results noted. Will prescribe patient  hydrocodone for pain and she will follow-up with her doctor if her symptoms worsen.  Final Clinical Impressions(s) / ED Diagnoses   Final diagnoses:  None    New Prescriptions New Prescriptions   No medications on file     Lacretia Leigh, MD 05/20/17 2201

## 2017-05-20 NOTE — ED Notes (Signed)
Pt in xray

## 2017-05-20 NOTE — ED Triage Notes (Addendum)
Pt arrives EMS after falling in yard while gardening. Pt stumbled back and fell hitting head on cement. Denies LOC. A&Ox 4  PT also reports lower back pain that began after fall

## 2017-10-31 DIAGNOSIS — C4491 Basal cell carcinoma of skin, unspecified: Secondary | ICD-10-CM

## 2017-10-31 HISTORY — DX: Basal cell carcinoma of skin, unspecified: C44.91

## 2018-11-06 ENCOUNTER — Other Ambulatory Visit (HOSPITAL_COMMUNITY): Payer: Self-pay | Admitting: Family Medicine

## 2018-11-06 ENCOUNTER — Other Ambulatory Visit: Payer: Self-pay | Admitting: Family Medicine

## 2018-11-06 DIAGNOSIS — R590 Localized enlarged lymph nodes: Secondary | ICD-10-CM

## 2018-11-06 DIAGNOSIS — R9389 Abnormal findings on diagnostic imaging of other specified body structures: Secondary | ICD-10-CM

## 2018-11-06 DIAGNOSIS — R918 Other nonspecific abnormal finding of lung field: Secondary | ICD-10-CM

## 2018-11-10 ENCOUNTER — Ambulatory Visit (HOSPITAL_COMMUNITY)
Admission: RE | Admit: 2018-11-10 | Discharge: 2018-11-10 | Disposition: A | Discharge status: Home / Self Care | Payer: Medicare Other | Source: Ambulatory Visit | Attending: Family Medicine | Admitting: Family Medicine

## 2018-11-10 DIAGNOSIS — R9389 Abnormal findings on diagnostic imaging of other specified body structures: Secondary | ICD-10-CM

## 2018-11-10 DIAGNOSIS — R590 Localized enlarged lymph nodes: Secondary | ICD-10-CM | POA: Diagnosis present

## 2018-11-10 DIAGNOSIS — R918 Other nonspecific abnormal finding of lung field: Secondary | ICD-10-CM | POA: Insufficient documentation

## 2018-11-10 MED ORDER — FLUDEOXYGLUCOSE F - 18 (FDG) INJECTION
11.1000 | Freq: Once | INTRAVENOUS | Status: AC | PRN
  Administered 2018-11-10: 11.1 via INTRAVENOUS

## 2019-06-02 ENCOUNTER — Encounter: Payer: Self-pay | Admitting: *Deleted

## 2019-06-21 IMAGING — CT NM PET TUM IMG INITIAL (PI) SKULL BASE T - THIGH
1 of 4 series · 2 of 25 positions shown · non-contrast
Comparison: CT chest dated 11/05/2018

CLINICAL DATA: Initial treatment strategy for right pulmonary
nodule.

EXAM:
NUCLEAR MEDICINE PET SKULL BASE TO THIGH
TECHNIQUE: 11.1 mCi F-18 FDG was injected intravenously. Full-ring PET imaging
was performed from the skull base to thigh after the radiotracer. CT
data was obtained and used for attenuation correction and anatomic
localization.
Fasting blood glucose: 65 mg/dl

[Series 3: ct wb fusion · axial · 5.0mm · 0.98mm/px · z∈[-638,-512]mm · 2 of 351 slices shown]
[im 301/351  brain]
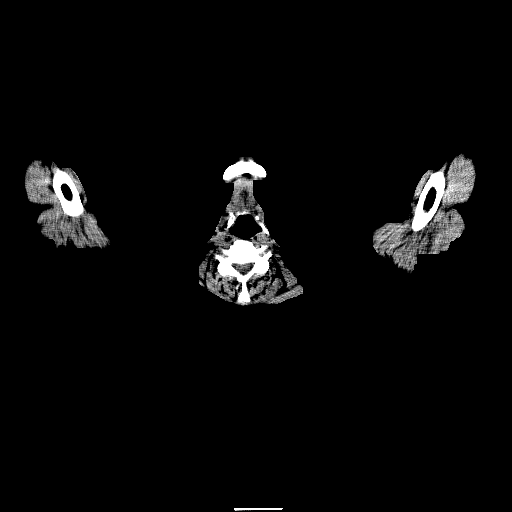
[im 351/351  brain]
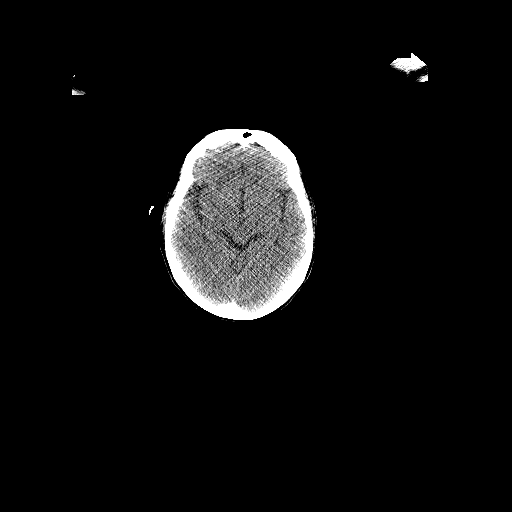

[2 of 25 positions shown; findings below may reference images not displayed]

FINDINGS: Mediastinal blood pool activity: SUV max

NECK: No significant abnormal hypermetabolic activity in this
region.

Incidental CT findings: Left mastoid effusion.

CHEST: [DATE] by 1.7 cm right middle lobe pulmonary nodule maximum SUV
3.7.

On the prior exam there was concern for a mildly prominent right
hilar lymph node; on today's exam this has a maximum SUV of 2.9.

Clustered tree-in-bud reticulonodular opacities in the right upper
lobe are similar to the 11/05/2018 exam with maximum SUV of 1.1.
These are characteristic for atypical infectious bronchiolitis.

Incidental CT findings: Coronary, aortic arch, and branch vessel
atherosclerotic vascular disease.

ABDOMEN/PELVIS: Physiologic activity noted in bowel.

Incidental CT findings: 1.9 by 2.3 cm right adrenal adenoma, not
hypermetabolic. Aortoiliac atherosclerotic vascular disease. Sigmoid
colon diverticulosis.

SKELETON: Displaced fracture laterally involving the right fifth rib
with accentuated metabolic activity, maximum SUV 4.5. There is some
lucency around the fracture site which could be from healing rather
than necessarily indicating a malignant fracture.

Focal hypermetabolic activity anteriorly in the right fourth rib
without a readily appreciable lesion on the CT data, maximum SUV
4.3.

There is transverse deformity of the upper sternal body suggesting a
healing fracture, maximum SUV 2.5.

Incidental CT findings: none
IMPRESSION: 1. Moderately hypermetabolic right middle lobe pulmonary nodule
measuring 2.0 cm in diameter, maximum SUV 3.7, high suspicion for
malignancy.
2. Faintly accentuated metabolic activity associated with the mildly
enlarged right hilar/infrahilar lymph node, maximum SUV 2.9 which is
above the background blood pool activity of 2.3. Appearance
suspicious for early malignant involvement.
3. Hypermetabolic activity in the right fourth and fifth ribs. The
patient reportedly had a fall precipitating rib fracture. Although
there is some accentuated metabolic activity, I do not see a
definite associated lesion, and accordingly these rib fractures are
probably benign.
4. Transverse sternal fracture of the upper sternal body with
low-grade activity favoring healing fracture.
5. Minimal atypical infectious bronchiolitis in the right upper
lobe.
6. Left mastoid effusion.
7.  Aortic Atherosclerosis ([id number]).  Coronary atherosclerosis.
8. Sigmoid colon diverticulosis.

## 2020-02-23 ENCOUNTER — Telehealth (HOSPITAL_COMMUNITY): Payer: Self-pay

## 2020-02-23 NOTE — Telephone Encounter (Signed)

## 2020-02-24 ENCOUNTER — Encounter: Payer: Self-pay | Admitting: Vascular Surgery

## 2020-02-24 ENCOUNTER — Other Ambulatory Visit: Payer: Self-pay

## 2020-02-24 ENCOUNTER — Ambulatory Visit (INDEPENDENT_AMBULATORY_CARE_PROVIDER_SITE_OTHER): Payer: Medicare Other | Admitting: Vascular Surgery

## 2020-02-24 VITALS — BP 144/79 | HR 56 | Temp 98.0°F | Resp 20 | Ht 66.0 in | Wt 173.7 lb

## 2020-02-24 DIAGNOSIS — I739 Peripheral vascular disease, unspecified: Secondary | ICD-10-CM

## 2020-02-24 NOTE — Progress Notes (Signed)
REASON FOR CONSULT:    To evaluate for peripheral vascular disease.  The consult is requested by Dr. Steffanie Rainwater.  ASSESSMENT & PLAN:   PERIPHERAL VASCULAR DISEASE: This patient does have evidence of infrainguinal arterial occlusive disease on the left however the small wound between the fourth and fifth toes has healed.  Her activity is limited by her arthritis in her knees so I do not get any history of claudication or rest pain.  I explained that I would only consider arteriography if the wound returned or she developed rest pain in her left foot.  However currently she has reasonable Doppler signals in the foot and given that she is 86 I would not recommend an aggressive approach to her infrainguinal arterial occlusive disease.  She is not a smoker.  I encouraged her to stay as active as possible.  I have ordered follow-up ABIs in 6 months and I will see her back at that time.  She knows to call sooner if her symptoms change or she develops her current wound on her left foot.   Deitra Mayo, MD Office: [phone]   HPI:   Holly Tucker is a pleasant 86 y.o. female, who was referred for evaluation of peripheral vascular disease.  I reviewed the records from the referring office.  The patient was seen on 01/28/2020.  She was being followed with a wound on her left fifth toe.  She was on Keflex.  She had noninvasive studies done at [facility].  This showed that on the right side she had a normal ABI with possible small vessel disease of the foot.  On the left side she had evidence of moderate peripheral vascular disease with possible femoropopliteal disease.  On my history the patient denies any history of claudication.  Her activity is limited by her arthritis in her knees.  She has bone spurs on the left knee and has had a total knee replacement on the right.  She denies any history of rest pain.  She states that she had a small wound between her fourth and fifth toes on the  left foot which took 2 months to heal.  She had been on antibiotics but is no longer on antibiotics.  She has been soaking her foot in lukewarm Epson salt and keeping a dry gauze between the fourth and fifth toes.  She denies fever or chills.  Past Medical History:  Diagnosis Date   Arthritis    BCC (basal cell carcinoma) 10/31/2017   right upper sideback-tx p bx   Pneumonia     History reviewed. No pertinent family history.  SOCIAL HISTORY: Social History   Socioeconomic History   Marital status: Widowed    Spouse name: Not on file   Number of children: Not on file   Years of education: Not on file   Highest education level: Not on file  Occupational History   Not on file  Tobacco Use   Smoking status: Never Smoker   Smokeless tobacco: Never Used  Substance and Sexual Activity   Alcohol use: No   Drug use: No   Sexual activity: Not on file  Other Topics Concern   Not on file  Social History Narrative   Not on file   Social Determinants of Health   Financial Resource Strain:    Difficulty of Paying Living Expenses:   Food Insecurity:    Worried About [place] in the Last Year:    Ran Out  of Food in the Last Year:   Transportation Needs:    Film/video editor (Medical):    Lack of Transportation (Non-Medical):   Physical Activity:    Days of Exercise per Week:    Minutes of Exercise per Session:   Stress:    Feeling of Stress :   Social Connections:    Frequency of Communication with Friends and Family:    Frequency of Social Gatherings with Friends and Family:    Attends Religious Services:    Active Member of Clubs or Organizations:    Attends Archivist Meetings:    Marital Status:   Intimate Partner Violence:    Fear of Current or Ex-Partner:    Emotionally Abused:    Physically Abused:    Sexually Abused:     Allergies  Allergen Reactions   Sulfa Antibiotics Hives, Itching and Swelling     Current Outpatient Medications  Medication Sig Dispense Refill   acetaminophen (TYLENOL) 500 MG tablet Take 1,000 mg by mouth 3 (three) times daily as needed for headache (pain).     amitriptyline (ELAVIL) 50 MG tablet Take 100 mg by mouth at bedtime.     ascorbic acid (VITAMIN C) 1000 MG tablet Take by mouth.     baclofen (LIORESAL) 20 MG tablet Take 1 tablet (20 mg total) by mouth at bedtime. 30 each 1   BIOTIN 5000 PO Take 5,000 mcg by mouth at bedtime.      Coenzyme Q10 (COQ-10) 200 MG CAPS Take 200 mg by mouth at bedtime.      cyanocobalamin 1000 MCG tablet Take by mouth.     gabapentin (NEURONTIN) 300 MG capsule Take 300 mg by mouth 3 (three) times daily.     Glucosamine HCl-MSM (GLUCOSAMINE-MSM PO) Take 1 tablet by mouth 2 (two) times daily.     meloxicam (MOBIC) 15 MG tablet Take 15 mg by mouth at bedtime.      Multiple Vitamin (MULTIVITAMIN WITH MINERALS) TABS tablet Take 1 tablet by mouth daily.     Omega-3 1000 MG CAPS Take by mouth.     PRESCRIPTION MEDICATION Apply 1 g topically See admin instructions. Pain cream compounded by [facility], [place], [place]: apply 1 gram topically twice daily for pain, may also use a third time as needed for pain.     Probiotic Product (PROBIOTIC ADVANCED PO) Take by mouth.     No current facility-administered medications for this visit.    REVIEW OF SYSTEMS:  [X]  denotes positive finding, [ ]  denotes negative finding Cardiac  Comments:  Chest pain or chest pressure:    Shortness of breath upon exertion:    Short of breath when lying flat:    Irregular heart rhythm:        Vascular    Pain in calf, thigh, or hip brought on by ambulation: x   Pain in feet at night that wakes you up from your sleep:     Blood clot in your veins:    Leg swelling:         Pulmonary    Oxygen at home:    Productive cough:     Wheezing:         Neurologic    Sudden weakness in arms or legs:     Sudden numbness in  arms or legs:     Sudden onset of difficulty speaking or slurred speech:    Temporary loss of vision in one eye:     Problems  with dizziness:         Gastrointestinal    Blood in stool:     Vomited blood:         Genitourinary    Burning when urinating:     Blood in urine:        Psychiatric    Major depression:         Hematologic    Bleeding problems:    Problems with blood clotting too easily:        Skin    Rashes or ulcers:        Constitutional    Fever or chills:     PHYSICAL EXAM:   Vitals:   02/24/20 1033  BP: (!) 144/79  Pulse: (!) 56  Resp: 20  Temp: 98 F (36.7 C)  SpO2: 93%  Weight: 173 lb 11.2 oz (78.8 kg)  Height: 5\' 6"  (1.676 m)    GENERAL: The patient is a well-nourished female, in no acute distress. The vital signs are documented above. CARDIAC: There is a regular rate and rhythm.  VASCULAR: I do not detect carotid bruits. On the right side she has a palpable femoral, popliteal pulse.  I cannot palpate pedal pulses but she has brisk biphasic Doppler signals in the right foot. On the left side she has a normal femoral pulse.  I cannot palpate a popliteal or pedal pulses.  She has a monophasic dorsalis pedis and posterior tibial signal on the left. She has no significant lower extremity swelling. PULMONARY: There is good air exchange bilaterally without wheezing or rales. ABDOMEN: Soft and non-tender with normal pitched bowel sounds.  MUSCULOSKELETAL: There are no major deformities or cyanosis. NEUROLOGIC: No focal weakness or paresthesias are detected. SKIN: Currently the wound between her fourth and fifth toes has healed.    PSYCHIATRIC: The patient has a normal affect.  DATA:    ARTERIAL DOPPLER STUDY: I reviewed the arterial Doppler study that was done at [facility].  On the right side ABI was 100%.  Digital waveforms were dampened suggesting small vessel disease.  On the left side ABI was 67%.

## 2020-02-25 ENCOUNTER — Other Ambulatory Visit: Payer: Self-pay | Admitting: *Deleted

## 2020-02-25 DIAGNOSIS — I739 Peripheral vascular disease, unspecified: Secondary | ICD-10-CM

## 2021-05-09 ENCOUNTER — Other Ambulatory Visit: Payer: Self-pay | Admitting: *Deleted

## 2021-05-09 DIAGNOSIS — I739 Peripheral vascular disease, unspecified: Secondary | ICD-10-CM

## 2021-05-10 ENCOUNTER — Other Ambulatory Visit: Payer: Self-pay

## 2021-05-10 ENCOUNTER — Ambulatory Visit (HOSPITAL_COMMUNITY)
Admission: RE | Admit: 2021-05-10 | Discharge: 2021-05-10 | Disposition: A | Discharge status: Home / Self Care | Payer: Medicare Other | Source: Ambulatory Visit | Attending: Vascular Surgery | Admitting: Vascular Surgery

## 2021-05-10 ENCOUNTER — Ambulatory Visit: Payer: Medicare Other | Admitting: Physician Assistant

## 2021-05-10 VITALS — BP 140/76 | HR 67 | Temp 97.1°F | Resp 16 | Ht 66.0 in | Wt 167.8 lb

## 2021-05-10 DIAGNOSIS — I739 Peripheral vascular disease, unspecified: Secondary | ICD-10-CM

## 2021-05-10 NOTE — Progress Notes (Signed)
History of Present Illness:  Patient is a 87 y.o. year old female who presents for evaluation of PAD with history of ulcer on the left foot between the 4th and 5th toes.  She was seen by Dr. Rich Reining on 02/24/20 with right LE ABI of 100 % and left 67%.      She denise symptoms of claudication, rest pain or new non healing wounds.  She continues to soak her feet daily in episome salts and paints the wound between her 4th and 5th toes.  She has known peripheral neuropathy with a history of DM.      Past Medical History:  Diagnosis Date   Arthritis    BCC (basal cell carcinoma) 10/31/2017   right upper sideback-tx p bx   Pneumonia     Past Surgical History:  Procedure Laterality Date   ABDOMINAL HYSTERECTOMY     APPENDECTOMY     taken when had hysterectomy   EYE SURGERY     bilateral cataract surgery with lens implants   TONSILLECTOMY     age 29   TOTAL KNEE ARTHROPLASTY Right 12/21/2016   Procedure: RIGHT TOTAL KNEE ARTHROPLASTY;  Surgeon: Sydnee Cabal, MD;  Location: [facility];  Service: Orthopedics;  Laterality: Right;    ROS:   General:  No weight loss, Fever, chills  HEENT: No recent headaches, no nasal bleeding, no visual changes, no sore throat  Neurologic: No dizziness, blackouts, seizures. No recent symptoms of stroke or mini- stroke. No recent episodes of slurred speech, or temporary blindness.  Cardiac: No recent episodes of chest pain/pressure, no shortness of breath at rest.  No shortness of breath with exertion.  Denies history of atrial fibrillation or irregular heartbeat  Vascular: No history of rest pain in feet.  No history of claudication.  No history of non-healing ulcer, No history of DVT   Pulmonary: No home oxygen, no productive cough, no hemoptysis,  No asthma or wheezing  Musculoskeletal:  [ x] Arthritis, [ ]  Low back pain,  [ ]  Joint pain  Hematologic:No history of hypercoagulable state.  No history of easy bleeding.  No history of  anemia  Gastrointestinal: No hematochezia or melena,  No gastroesophageal reflux, no trouble swallowing  Urinary: [ ]  chronic Kidney disease, [ ]  on HD - [ ]  MWF or [ ]  TTHS, [ ]  Burning with urination, [ ]  Frequent urination, [ ]  Difficulty urinating;   Skin: No rashes  Psychological: No history of anxiety,  No history of depression  Social History Social History   Tobacco Use   Smoking status: Never   Smokeless tobacco: Never  Vaping Use   Vaping Use: Never used  Substance Use Topics   Alcohol use: No   Drug use: No    Family History History reviewed. No pertinent family history.  Allergies  Allergies  Allergen Reactions   Sulfa Antibiotics Hives, Itching and Swelling     Current Outpatient Medications  Medication Sig Dispense Refill   acetaminophen (TYLENOL) 500 MG tablet Take 1,000 mg by mouth 3 (three) times daily as needed for headache (pain).     amitriptyline (ELAVIL) 50 MG tablet Take 100 mg by mouth at bedtime.     ascorbic acid (VITAMIN C) 1000 MG tablet Take by mouth.     baclofen (LIORESAL) 20 MG tablet Take 1 tablet (20 mg total) by mouth at bedtime. 30 each 1   BIOTIN 5000 PO Take 5,000 mcg by mouth at bedtime.  Coenzyme Q10 (COQ-10) 200 MG CAPS Take 200 mg by mouth at bedtime.      cyanocobalamin 1000 MCG tablet Take by mouth.     gabapentin (NEURONTIN) 300 MG capsule Take 300 mg by mouth 3 (three) times daily.     Glucosamine HCl-MSM (GLUCOSAMINE-MSM PO) Take 1 tablet by mouth 2 (two) times daily.     meloxicam (MOBIC) 15 MG tablet Take 15 mg by mouth at bedtime.      Multiple Vitamin (MULTIVITAMIN WITH MINERALS) TABS tablet Take 1 tablet by mouth daily.     Omega-3 1000 MG CAPS Take by mouth.     PRESCRIPTION MEDICATION Apply 1 g topically See admin instructions. Pain cream compounded by [facility], [place], [place]: apply 1 gram topically twice daily for pain, may also use a third time as needed for pain.     Probiotic  Product (PROBIOTIC ADVANCED PO) Take by mouth.     No current facility-administered medications for this visit.    Physical Examination  Vitals:   05/10/21 1143  BP: 140/76  Pulse: 67  Resp: 16  Temp: (!) 97.1 F (36.2 C)  TempSrc: Temporal  SpO2: 100%  Weight: 167 lb 12.8 oz (76.1 kg)  Height: 5\' 6"  (1.676 m)    Body mass index is 27.08 kg/m.  General:  Alert and oriented, no acute distress HEENT: Normal Neck: No bruit or JVD Pulmonary: Clear to auscultation bilaterally Cardiac: Regular Rate and Rhythm without murmur Abdomen: Soft, non-tender, non-distended, no mass, no scars Skin: No rash, left foot "kissing corn" wound between the 4th and 5th toes.   Musculoskeletal: No deformity or edema  Neurologic: Upper and lower extremity motor 5/5 and symmetric  DATA:     ABI Findings:  +---------+------------------+-----+---------+--------+  Right    Rt Pressure (mmHg)IndexWaveform Comment   +---------+------------------+-----+---------+--------+  Brachial 191                                       +---------+------------------+-----+---------+--------+  ATA      190               0.96                    +---------+------------------+-----+---------+--------+  PTA      166               0.84 biphasic           +---------+------------------+-----+---------+--------+  DP                              triphasic          +---------+------------------+-----+---------+--------+  Great Toe130               0.66                    +---------+------------------+-----+---------+--------+   +---------+------------------+-----+----------+-------+  Left     Lt Pressure (mmHg)IndexWaveform  Comment  +---------+------------------+-----+----------+-------+  Brachial 197                                       +---------+------------------+-----+----------+-------+  ATA      129               0.65                     +---------+------------------+-----+----------+-------+  PTA      87                0.44 monophasic         +---------+------------------+-----+----------+-------+  DP                              monophasic         +---------+------------------+-----+----------+-------+  Great Toe43                0.22                    +---------+------------------+-----+----------+-------+   +-------+-----------+-----------+------------+------------+  ABI/TBIToday's ABIToday's TBIPrevious ABIPrevious TBI  +-------+-----------+-----------+------------+------------+  Right  0.96       0.66                                 +-------+-----------+-----------+------------+------------+  Left   0.65       0.22                                 +-------+-----------+-----------+------------+------------+     Summary:  Right: Resting right ankle-brachial index is within normal range. No  evidence of significant right lower extremity arterial disease. The right  toe-brachial index is abnormal. RT great toe pressure = 130 mmHg.   Left: Resting left ankle-brachial index indicates moderate left lower  extremity arterial disease. The left toe-brachial index is abnormal. LT  Great toe pressure = 43 mmHg.    ASSESSMENT:  Left LE PAD likely infrainguinal disease on the left LE. No sign of infection at the chronic wound between the 4th and 5th toes. The small wound has not healed, nor has it become infected and it is kept clean.  Her mobility is limited by neuropathy not symptoms of claudication, rest pain or new non healing wounds.     PLAN: She will cont with episome salt, betadine and proper shoe ware.  She continues to live independently and functions well.  If she develops erythema, purulents or the toes become dark and discolored she will call.  I have scheduled her for repeat ABI's in 1 year.     Roxy Horseman PA-C [facility]  [place] Office: [phone]  MD in office [place]

## 2022-03-19 DEATH — deceased

## 2022-08-09 ENCOUNTER — Telehealth (HOSPITAL_COMMUNITY): Payer: Self-pay

## 2022-08-09 NOTE — Telephone Encounter (Signed)
Attempted contact, 08/09/22, both numbers out of service, tried E-contact, didn't get name of person that answered phone, informed deceased 02-Apr-2023.
